# Patient Record
Sex: Male | Born: 1965 | Race: White | Hispanic: No | Marital: Married | Smoking: Never smoker
Health system: Southern US, Community
[De-identification: ages and names within clinical notes are randomized; demographics above are authoritative.]

## PROBLEM LIST (undated history)

## (undated) DIAGNOSIS — K219 Gastro-esophageal reflux disease without esophagitis: Secondary | ICD-10-CM

## (undated) DIAGNOSIS — R112 Nausea with vomiting, unspecified: Secondary | ICD-10-CM

## (undated) DIAGNOSIS — F411 Generalized anxiety disorder: Secondary | ICD-10-CM

## (undated) DIAGNOSIS — K222 Esophageal obstruction: Secondary | ICD-10-CM

## (undated) DIAGNOSIS — E039 Hypothyroidism, unspecified: Secondary | ICD-10-CM

## (undated) DIAGNOSIS — E7800 Pure hypercholesterolemia, unspecified: Secondary | ICD-10-CM

## (undated) DIAGNOSIS — N200 Calculus of kidney: Secondary | ICD-10-CM

## (undated) DIAGNOSIS — K589 Irritable bowel syndrome without diarrhea: Secondary | ICD-10-CM

## (undated) DIAGNOSIS — R0789 Other chest pain: Secondary | ICD-10-CM

## (undated) DIAGNOSIS — Z87442 Personal history of urinary calculi: Secondary | ICD-10-CM

## (undated) DIAGNOSIS — Z86010 Personal history of colonic polyps: Secondary | ICD-10-CM

## (undated) DIAGNOSIS — K224 Dyskinesia of esophagus: Secondary | ICD-10-CM

## (undated) DIAGNOSIS — Z860101 Personal history of adenomatous and serrated colon polyps: Secondary | ICD-10-CM

## (undated) DIAGNOSIS — J309 Allergic rhinitis, unspecified: Secondary | ICD-10-CM

## (undated) DIAGNOSIS — F410 Panic disorder [episodic paroxysmal anxiety] without agoraphobia: Secondary | ICD-10-CM

## (undated) DIAGNOSIS — E785 Hyperlipidemia, unspecified: Secondary | ICD-10-CM

## (undated) DIAGNOSIS — Z8659 Personal history of other mental and behavioral disorders: Secondary | ICD-10-CM

## (undated) DIAGNOSIS — Z9889 Other specified postprocedural states: Secondary | ICD-10-CM

## (undated) HISTORY — PX: OTHER SURGICAL HISTORY: SHX169

## (undated) HISTORY — DX: Other chest pain: R07.89

## (undated) HISTORY — DX: Calculus of kidney: N20.0

## (undated) HISTORY — DX: Generalized anxiety disorder: F41.1

## (undated) HISTORY — DX: Personal history of colonic polyps: Z86.010

## (undated) HISTORY — DX: Pure hypercholesterolemia, unspecified: E78.00

## (undated) HISTORY — DX: Dyskinesia of esophagus: K22.4

## (undated) HISTORY — DX: Esophageal obstruction: K22.2

## (undated) HISTORY — PX: WISDOM TOOTH EXTRACTION: SHX21

## (undated) HISTORY — DX: Personal history of adenomatous and serrated colon polyps: Z86.0101

## (undated) HISTORY — DX: Panic disorder (episodic paroxysmal anxiety): F41.0

## (undated) HISTORY — DX: Irritable bowel syndrome, unspecified: K58.9

## (undated) HISTORY — DX: Allergic rhinitis, unspecified: J30.9

## (undated) HISTORY — DX: Personal history of other mental and behavioral disorders: Z86.59

## (undated) HISTORY — PX: TONSILLECTOMY: SUR1361

---

## 2001-06-13 ENCOUNTER — Emergency Department (HOSPITAL_COMMUNITY): Admission: EM | Admit: 2001-06-13 | Discharge: 2001-06-13 | Payer: Self-pay | Admitting: Emergency Medicine

## 2001-06-13 ENCOUNTER — Encounter: Payer: Self-pay | Admitting: Emergency Medicine

## 2001-12-01 ENCOUNTER — Encounter: Payer: Self-pay | Admitting: Family Medicine

## 2001-12-01 ENCOUNTER — Encounter: Admission: RE | Admit: 2001-12-01 | Discharge: 2001-12-01 | Payer: Self-pay | Admitting: Family Medicine

## 2002-08-12 ENCOUNTER — Ambulatory Visit (HOSPITAL_BASED_OUTPATIENT_CLINIC_OR_DEPARTMENT_OTHER): Admission: RE | Admit: 2002-08-12 | Discharge: 2002-08-12 | Payer: Self-pay | Admitting: General Surgery

## 2003-01-28 HISTORY — PX: INGUINAL HERNIA REPAIR: SUR1180

## 2003-12-29 ENCOUNTER — Encounter: Admission: RE | Admit: 2003-12-29 | Discharge: 2003-12-29 | Payer: Self-pay | Admitting: Family Medicine

## 2004-09-04 ENCOUNTER — Encounter: Admission: RE | Admit: 2004-09-04 | Discharge: 2004-09-04 | Payer: Self-pay | Admitting: Family Medicine

## 2004-09-19 ENCOUNTER — Ambulatory Visit (HOSPITAL_COMMUNITY): Admission: RE | Admit: 2004-09-19 | Discharge: 2004-09-19 | Payer: Self-pay | Admitting: Urology

## 2004-09-19 ENCOUNTER — Ambulatory Visit (HOSPITAL_BASED_OUTPATIENT_CLINIC_OR_DEPARTMENT_OTHER): Admission: RE | Admit: 2004-09-19 | Discharge: 2004-09-19 | Payer: Self-pay | Admitting: Urology

## 2004-10-17 ENCOUNTER — Ambulatory Visit (HOSPITAL_COMMUNITY): Admission: RE | Admit: 2004-10-17 | Discharge: 2004-10-17 | Payer: Self-pay | Admitting: Urology

## 2006-10-29 ENCOUNTER — Emergency Department (HOSPITAL_COMMUNITY): Admission: EM | Admit: 2006-10-29 | Discharge: 2006-10-30 | Payer: Self-pay | Admitting: *Deleted

## 2008-05-15 ENCOUNTER — Encounter: Admission: RE | Admit: 2008-05-15 | Discharge: 2008-05-15 | Payer: Self-pay | Admitting: Family Medicine

## 2008-05-16 ENCOUNTER — Encounter: Admission: RE | Admit: 2008-05-16 | Discharge: 2008-05-16 | Payer: Self-pay | Admitting: Family Medicine

## 2008-09-25 ENCOUNTER — Encounter: Admission: RE | Admit: 2008-09-25 | Discharge: 2008-09-25 | Payer: Self-pay | Admitting: Family Medicine

## 2009-10-18 ENCOUNTER — Encounter: Admission: RE | Admit: 2009-10-18 | Discharge: 2009-10-18 | Payer: Self-pay | Admitting: Family Medicine

## 2010-06-14 NOTE — Op Note (Signed)
Sean Dickson, Sean Dickson                  ACCOUNT NO.:  [id number]   MEDICAL RECORD NO.:  [id number]          PATIENT TYPE:  AMB   LOCATION:  DAY                          FACILITY:  [facility]   PHYSICIAN:  Mark C. Vernie Ammons, M.D.  DATE OF BIRTH:  December 01, 1965   DATE OF PROCEDURE:  09/19/2004  DATE OF DISCHARGE:                                 OPERATIVE REPORT   PREOPERATIVE DIAGNOSIS:  Right renal calculi.   POSTOPERATIVE DIAGNOSIS:  Right renal calculi.   PROCEDURE:  Cystoscopy, right retrograde pyelogram with interpretation and  right double-J stent placement.   SURGEON:  Dr. Vernie Ammons   ANESTHESIA:  General.   DRAINS:  4.5 French 28 cm double-J stent in the right ureter (no string).   BLOOD LOSS:  None.   SPECIMENS:  None.   COMPLICATIONS:  None.   INDICATIONS:  The patient is a 45 year old male who has 2 right renal  calculi.  Their size measures approximately 10 x 6 and 12 x 9 mm.  Due to  the large total stone bulk, we have discussed treatment options.  The  patient has elected to proceed with lithotripsy and in preparation for that,  a double-J stent is to be placed.   DESCRIPTION OF OPERATION:  After informed consent, the patient brought to  the major OR, placed on table, administered general anesthesia, then moved  to the dorsolithotomy position.  His genitalia was sterilely prepped and  draped.  Initially, a 21-French cystoscope with obturator was introduced in  the bladder.  The bladder was drained and the 12-degree lens was inserted.  The bladder was fully inspected and noted be free of any tumor, stones, or  inflammatory lesions.  Ureteral orifices normal in configuration and  position.  The prostatic urethra was visualized and noted to be normal  without lesions and nonobstructing.   An open-ended 6-French ureteral catheter was then placed into the right  orifice and a right retrograde pyelogram was performed by instilling  contrast material through the stent up the right  ureter under direct  fluoroscopic control.  The ureter was noted to be normal throughout its  entire length with no filling defects, stones, or other abnormality.  The  renal pelvis was relatively small.  The collecting systems were normal.  Two  stones were seen in the lower pole calix which is noted to have a wide  infundibulum.   A 0.038 inch floppy tip guidewire was then passed through the open-ended  stent, up the right ureter under fluoroscopic control, and the open-end  stent was then removed.  The double-J stent was then passed over the  guidewire into the renal pelvis and the guidewire partially removed with  good curl being noted in the renal pelvis.  I then removed the guidewire  entirely with good curl being noted in the bladder as well.  The bladder was  drained; the  patient received a B&O suppository and 2% lidocaine urethral anesthetic and  was awakened and taken to recovery room in stable satisfactory condition.  He tolerated the procedure well.  There were no  intraoperative  complications.  He has a prescription for Vicodin and will undergo  lithotripsy later today.      Mark C. Vernie Ammons, M.D.  Electronically Signed     MCO/MEDQ  D:  09/19/2004  T:  09/19/2004  Job:  [id number]

## 2010-06-14 NOTE — Op Note (Signed)
NAME:  Sean Dickson, Sean Dickson                            ACCOUNT NO.:  [id number]   MEDICAL RECORD NO.:  [id number]                   PATIENT TYPE:  AMB   LOCATION:  DSC                                  FACILITY:  [facility]   PHYSICIAN:  Jimmye Norman III, M.D.               DATE OF BIRTH:  [birth year]   DATE OF PROCEDURE:  08/12/2002  DATE OF DISCHARGE:                                 OPERATIVE REPORT   PREOPERATIVE DIAGNOSIS:  Right inguinal hernia with possible left inguinal  hernia.   POSTOPERATIVE DIAGNOSIS:  Right inguinal hernia, small, indirect and larger  direct with no left inguinal hernia.   PROCEDURE:  Laparoscopic right inguinal hernia repair.   SURGEON:  Jimmye Norman, M.D.   ASSISTANT:  None.   ANESTHESIA:  General endotracheal.   ESTIMATED BLOOD LOSS:  Less than 20-30 mL.   COMPLICATIONS:  None.   CONDITION:  Stable.   FINDINGS:  Small, indirect hernia on the right side with a larger direct  defect.  There was no hernia on the left side but a small lipoma.   OPERATION:  The patient was taken to the operating room, placed on the table  in a supine position.  After an adequate endotracheal anesthetic was  administered, he was prepped and draped in the usual sterile manner,  exposing in the midline of the abdomen.   In the right paraumbilical area, a transverse incision was made 1.5 cm long  with a #15 blade, taken down to the anterior rectus sheath.  This was split  using #15 blade, and then we split the rectus muscle down to the posterior  rectus sheath.  It was at this plane that the dissecting balloon was passed  down into the pubic tubercle and then subsequently insufflated with 35 pumps  of gas.  Once this was done, we were able to see with the endoscope that was  passed through the dissecting balloon that there was adequate exposure in  the preperitoneal space, exposing the Cooper's ligament on both sides.   We subsequently removed the balloon and then placed an  operating cannula  into the preperitoneal space, inflating its ballon up to 30 mL of air.  We  then passed the endoscope with attached light source and camera into the  preperitoneal space, then passed two 5 mm cannulas in the midline at the  suprapubic area and sort of equal distance between the suprapubic area and  the umbilicus.  Care was taken to pass these directly as they both had sharp  jibs.   Once all cannulas were in place, we started the dissection on the right side  where we could see the small indirect sac tethering along with the spermatic  cord and also the direct defect sort of superolateral to the spermatic cord.  We dissected away the sac from the cord and circumferentially dissected a  window around  the spermatic cord on the right side and subsequently tacked  down a piece of mesh measuring approximately 8 x 5 cm in size.  We did not  split the mesh and placed it on top of the spermatic cord, making sure that  the dissected sac was tacked down to the outer surface of the mesh itself.  Care was taken not to put down low lateral with spiral tacking sutures.   We dissected out the spermatic cord on the left side and could not find  evidence of a hernia sac or direct defect.  Therefore, was not done on the  left side.  Once we had completed all, allowed all gas to escape.   We reapproximated the anterior fascia using figure-of-eight suture of 0  Vicryl.  We used 0.25% Marcaine with epinephrine at all sites and then  closed the skin using running subcuticular suture of 5-0 Vicryl.  Sterile  dressings were applied.  All needle counts, sponge counts, and instrument  counts were correct.                                               Kathrin Ruddy, M.D.    JW/MEDQ  D:  08/12/2002  T:  08/14/2002  Job:  [id number]

## 2010-11-07 LAB — BASIC METABOLIC PANEL
CO2: 28
Creatinine, Ser: 1.23
Glucose, Bld: 126 — ABNORMAL HIGH
Potassium: 3.9
Sodium: 138

## 2010-11-07 LAB — CBC
Hemoglobin: 14.3
MCHC: 34.3
MCV: 91.2
WBC: 6.4

## 2010-11-07 LAB — POCT CARDIAC MARKERS
CKMB, poc: 1
Troponin i, poc: <0.05

## 2011-01-04 ENCOUNTER — Encounter: Payer: Self-pay | Admitting: *Deleted

## 2011-01-04 ENCOUNTER — Other Ambulatory Visit: Payer: Self-pay

## 2011-01-04 ENCOUNTER — Emergency Department (INDEPENDENT_AMBULATORY_CARE_PROVIDER_SITE_OTHER): Payer: 59

## 2011-01-04 ENCOUNTER — Emergency Department (HOSPITAL_BASED_OUTPATIENT_CLINIC_OR_DEPARTMENT_OTHER)
Admission: EM | Admit: 2011-01-04 | Discharge: 2011-01-04 | Disposition: A | Discharge status: Home / Self Care | Payer: 59 | Attending: Emergency Medicine | Admitting: Emergency Medicine

## 2011-01-04 DIAGNOSIS — R079 Chest pain, unspecified: Secondary | ICD-10-CM | POA: Insufficient documentation

## 2011-01-04 DIAGNOSIS — K219 Gastro-esophageal reflux disease without esophagitis: Secondary | ICD-10-CM | POA: Insufficient documentation

## 2011-01-04 HISTORY — DX: Gastro-esophageal reflux disease without esophagitis: K21.9

## 2011-01-04 LAB — BASIC METABOLIC PANEL
BUN: 20 mg/dL (ref 6–23)
Calcium: 10 mg/dL (ref 8.4–10.5)
Chloride: 101 mEq/L (ref 96–112)
Creatinine, Ser: 1 mg/dL (ref 0.50–1.35)
Glucose, Bld: 104 mg/dL — ABNORMAL HIGH (ref 70–99)

## 2011-01-04 LAB — PRO B NATRIURETIC PEPTIDE: Pro B Natriuretic peptide (BNP): 80.9 pg/mL (ref 0–125)

## 2011-01-04 LAB — CARDIAC PANEL(CRET KIN+CKTOT+MB+TROPI)
CK, MB: 2.9 ng/mL (ref 0.3–4.0)
Relative Index: 2 (ref 0.0–2.5)

## 2011-01-04 LAB — CBC
MCV: 90.7 fL (ref 78.0–100.0)
Platelets: 233 10*3/uL (ref 150–400)
RBC: 4.62 MIL/uL (ref 4.22–5.81)

## 2011-01-04 MED ORDER — IBUPROFEN 400 MG PO TABS
600.0000 mg | ORAL_TABLET | Freq: Once | ORAL | Status: AC
  Administered 2011-01-04: 600 mg via ORAL
  Filled 2011-01-04: qty 1

## 2011-01-04 MED ORDER — ASPIRIN 81 MG PO CHEW
324.0000 mg | CHEWABLE_TABLET | Freq: Once | ORAL | Status: AC
  Administered 2011-01-04: 324 mg via ORAL

## 2011-01-04 MED ORDER — NITROGLYCERIN 0.4 MG SL SUBL
0.4000 mg | SUBLINGUAL_TABLET | SUBLINGUAL | Status: DC | PRN
  Administered 2011-01-04 (×2): 0.4 mg via SUBLINGUAL
  Filled 2011-01-04: qty 25

## 2011-01-04 MED ORDER — ACETAMINOPHEN 500 MG PO TABS
1000.0000 mg | ORAL_TABLET | Freq: Once | ORAL | Status: AC
  Administered 2011-01-04: 1000 mg via ORAL
  Filled 2011-01-04: qty 2

## 2011-01-04 MED ORDER — ASPIRIN 81 MG PO CHEW
CHEWABLE_TABLET | ORAL | Status: AC
  Administered 2011-01-04: 324 mg via ORAL
  Filled 2011-01-04: qty 4

## 2011-01-04 MED ORDER — SODIUM CHLORIDE 0.9 % IV SOLN
Freq: Once | INTRAVENOUS | Status: AC
  Administered 2011-01-04: 14:00:00 via INTRAVENOUS

## 2011-01-04 NOTE — ED Notes (Signed)
Patient states pain level 2 of 10

## 2011-01-04 NOTE — ED Notes (Signed)
Chest pain left sternal sore radiates to left arm, pain accompanied by shortness of breath, nausea and diaporesis x 1 day,

## 2011-01-04 NOTE — ED Provider Notes (Signed)
History     CSN: [id number] Arrival date & time: 01/04/2011  1:20 PM   First MD Initiated Contact with Patient 01/04/11 1436      Chief Complaint  Patient presents with  . Chest Pain    (Consider location/radiation/quality/duration/timing/severity/associated sxs/prior treatment) HPI Comments: Patient presents with left-sided chest pain that began earlier today.  He describes it as a tightness sensation.  It is not associated with any cough, fevers, shortness of breath.  It is not pleuritic in nature.  Patient does describe some lightheadedness and dizziness with this but did not have any syncopal episode with this.  He did note some sweating earlier with this but that has resolved.  His pain was approximately a 3/10 on arrival here and did receive a nitroglycerin per the nursing protocol which may have relieved his pain but patient notes that once he was here resting he was pain-free.  He did drive himself here.  He has not had prior cardiac history.  Patient denies that he has ever smoked, drank or used any drugs.  He states he did have a stress test several years ago and was normal but has not had one in the last one to 2 years.  He does have a primary care physician whom he sees regularly.    Patient is a 45 y.o. male presenting with chest pain. The history is provided by the patient. No language interpreter was used.  Chest Pain The chest pain began 3 - 5 hours ago. Chest pain occurs intermittently. The chest pain is resolved. The severity of the pain is moderate. The quality of the pain is described as tightness. The pain does not radiate. Primary symptoms include dizziness. Pertinent negatives for primary symptoms include no fever, no fatigue, no syncope, no shortness of breath, no cough, no wheezing, no palpitations, no abdominal pain, no nausea, no vomiting and no altered mental status.  Dizziness also occurs with diaphoresis. Dizziness does not occur with nausea or vomiting.    Associated symptoms include diaphoresis. He tried nothing for the symptoms. Risk factors include male gender.     Past Medical History  Diagnosis Date  . GERD (gastroesophageal reflux disease)     History reviewed. No pertinent past surgical history.  History reviewed. No pertinent family history.  History  Substance Use Topics  . Smoking status: Never Smoker   . Smokeless tobacco: Not on file  . Alcohol Use: No      Review of Systems  Constitutional: Positive for diaphoresis. Negative for fever, chills and fatigue.  HENT: Negative.   Eyes: Negative.  Negative for discharge and redness.  Respiratory: Positive for chest tightness. Negative for cough, shortness of breath and wheezing.   Cardiovascular: Positive for chest pain. Negative for palpitations and syncope.  Gastrointestinal: Negative.  Negative for nausea, vomiting and abdominal pain.  Genitourinary: Negative.  Negative for hematuria.  Musculoskeletal: Negative.  Negative for back pain.  Skin: Negative.  Negative for color change and rash.  Neurological: Positive for dizziness and light-headedness. Negative for syncope and headaches.  Hematological: Negative.  Negative for adenopathy.  Psychiatric/Behavioral: Negative.  Negative for confusion and altered mental status.  All other systems reviewed and are negative.    Allergies  Codeine  Home Medications   Current Outpatient Rx  Name Route Sig Dispense Refill  . ESOMEPRAZOLE MAGNESIUM 20 MG PO CPDR Oral Take 20 mg by mouth daily before breakfast.        BP 117/74  Pulse 77  Temp(Src)  97.9 F (36.6 C) (Oral)  Resp 18  Ht 6\' 4"  (1.93 m)  Wt 275 lb (124.739 kg)  BMI 33.47 kg/m2  SpO2 96%  Physical Exam  Constitutional: He is oriented to person, place, and time. He appears well-developed and well-nourished.  Non-toxic appearance. He does not have a sickly appearance.  HENT:  Head: Normocephalic and atraumatic.  Eyes: Conjunctivae, EOM and lids are  normal. Pupils are equal, round, and reactive to light.  Neck: Trachea normal, normal range of motion and full passive range of motion without pain. Neck supple.  Cardiovascular: Normal rate, regular rhythm and normal heart sounds.   Pulmonary/Chest: Effort normal and breath sounds normal. No respiratory distress. He has no wheezes. He has no rales.  Abdominal: Soft. Normal appearance. He exhibits no distension. There is no tenderness. There is no rebound and no CVA tenderness.  Musculoskeletal: Normal range of motion.  Neurological: He is alert and oriented to person, place, and time. He has normal strength.  Skin: Skin is warm, dry and intact. No rash noted.  Psychiatric: He has a normal mood and affect. His behavior is normal. Judgment and thought content normal.    ED Course  Procedures (including critical care time)  Results for orders placed during the hospital encounter of 01/04/11  CBC      Component Value Range   WBC 5.4  4.0 - 10.5 (K/uL)   RBC 4.62  4.22 - 5.81 (MIL/uL)   Hemoglobin 14.2  13.0 - 17.0 (g/dL)   HCT 81.1  91.4 - 78.2 (%)   MCV 90.7  78.0 - 100.0 (fL)   MCH 30.7  26.0 - 34.0 (pg)   MCHC 33.9  30.0 - 36.0 (g/dL)   RDW 95.6  21.3 - 08.6 (%)   Platelets 233  150 - 400 (K/uL)  BASIC METABOLIC PANEL      Component Value Range   Sodium 139  135 - 145 (mEq/L)   Potassium 4.3  3.5 - 5.1 (mEq/L)   Chloride 101  96 - 112 (mEq/L)   CO2 29  19 - 32 (mEq/L)   Glucose, Bld 104 (*) 70 - 99 (mg/dL)   BUN 20  6 - 23 (mg/dL)   Creatinine, Ser 5.78  0.50 - 1.35 (mg/dL)   Calcium 46.9  8.4 - 10.5 (mg/dL)   GFR calc non Af Amer 90 (*) >90 (mL/min)   GFR calc Af Amer >90  >90 (mL/min)  PRO B NATRIURETIC PEPTIDE      Component Value Range   BNP, POC 80.9  0 - 125 (pg/mL)  CARDIAC PANEL(CRET KIN+CKTOT+MB+TROPI)      Component Value Range   Total CK 145  7 - 232 (U/L)   CK, MB 2.9  0.3 - 4.0 (ng/mL)   Troponin I <0.30  <0.30 (ng/mL)   Relative Index 2.0  0.0 - 2.5    Dg  Chest 2 View  01/04/2011  *RADIOLOGY REPORT*  Clinical Data: Chest pain  CHEST - 2 VIEW  Comparison: 10/29/2006  Findings: Normal heart size.  Clear lungs.  Low volumes.  No pneumothorax.  No pleural fluid. Increased AP diameter the chest.  IMPRESSION: No active cardiopulmonary disease.  Original Report Authenticated By: Donavan Burnet, M.D.      Date: 01/04/2011  Rate: 80  Rhythm: normal sinus rhythm  QRS Axis: left  Intervals: normal  ST/T Wave abnormalities: normal  Conduction Disutrbances:none  Narrative Interpretation:   Old EKG Reviewed: none available    MDM  Patient presents with low risk factors for ACS although his story could potentially be consistent with ACS.  His EKG is completely normal at this time.  His initial set of markers are normal as well.  Given this patient is a low risk by TIMI score and has a primary care physician whom he can followup with this week I feel that 2 sets of markers can effectively rule out MI in this patient and he can have an outpatient stress test later this week ordered by his primary care physician.  Patient also understands the importance of returning if he does have worsening pain, shortness of breath or other symptoms.        Nat Christen, MD 01/04/11 1556

## 2011-01-04 NOTE — ED Notes (Signed)
Pt presents to ED today with c/o CP with associated nausea and lightheadedness.  Pt has hx of same.  EKG done in triage

## 2011-01-04 NOTE — ED Notes (Signed)
Patient states pain level 0

## 2011-01-04 NOTE — ED Notes (Signed)
Patient states pain level 3 of 10

## 2012-11-23 ENCOUNTER — Ambulatory Visit (INDEPENDENT_AMBULATORY_CARE_PROVIDER_SITE_OTHER): Payer: 59 | Admitting: Podiatry

## 2012-11-23 ENCOUNTER — Encounter: Payer: Self-pay | Admitting: Podiatry

## 2012-11-23 VITALS — BP 144/84 | HR 78 | Resp 12 | Ht 76.0 in | Wt 275.0 lb

## 2012-11-23 DIAGNOSIS — Q828 Other specified congenital malformations of skin: Secondary | ICD-10-CM

## 2012-11-23 NOTE — Progress Notes (Signed)
  Subjective:    Patient ID: Sean Dickson, male    DOB: 01-Feb-1965, 47 y.o.   MRN: [id number]  HPI Comments: '' THE LT FOOT HAVE A KNOT UNDER THE HEEL. THE KNOT BEEN 3-4 WEEKS, BUT TODAY IS MUCH BETTER. ALSO, RT FOOT BIG TOE GET SORE ''     Review of Systems  Constitutional: Negative.   HENT: Negative.   Eyes: Negative.   Respiratory: Negative.   Cardiovascular: Negative.   Gastrointestinal: Negative.   Endocrine: Negative.   Genitourinary: Negative.   Musculoskeletal: Negative.   Skin: Negative.   Allergic/Immunologic: Negative.   Neurological: Negative.   Hematological: Negative.   Psychiatric/Behavioral: Negative.        Objective:   Physical Exam: I reviewed his past medical history is medications and allergies. His vital signs stable he is alert and oriented x3. Vascular evaluation is intact bilateral. Deep tendon reflexes and muscle strength is intact bilateral and full. Cutaneous evaluation demonstrates a reactive hyperkeratosis to the plantar aspect of his left heel. I debrided this today to normal tissue. His symptomatology was resolved at this time. Evaluation of the hallux nail right does not demonstrate an ingrown nail only irritation from the second toe along the tibial border. I did express to him that we can fix this with a matrixectomy at some point.        Assessment & Plan:  Impression: Poor keratoma plantar aspect left heel. Care rule out a wart at this time. Mild ingrown nail hallux right long fibular border.  Plan: We discussed in great detail today the etiology pathology conservative versus surgical therapies at this point we did discuss possible matrixectomy fibular border of the hallux right. I did discuss and perform a debridement of the reactive hyperkeratosis to the plantar left heel leaving him asymptomatic. I did offer him a chemical destruction of lesion he declined stating that he would sweating he could not keep it dry for 3 or 4 days. We'll followup  with him on an as-needed basis.

## 2012-12-20 ENCOUNTER — Telehealth: Payer: Self-pay | Admitting: *Deleted

## 2012-12-20 NOTE — Telephone Encounter (Signed)
Pt states that Dr Al Corpus scraped out a hard spot on his left heel about 4 weeks ago, and initially it began to fill better, but now hurts as before.  I checked his last visit, the area on his left heel would act like a blocked sweat gland and often may become plugged again and need additional treatment.  Transferred pt to scheduler.

## 2013-01-04 ENCOUNTER — Ambulatory Visit (INDEPENDENT_AMBULATORY_CARE_PROVIDER_SITE_OTHER): Payer: 59 | Admitting: Podiatry

## 2013-01-04 ENCOUNTER — Encounter: Payer: Self-pay | Admitting: Podiatry

## 2013-01-04 VITALS — BP 127/83 | HR 78 | Resp 16

## 2013-01-04 DIAGNOSIS — B079 Viral wart, unspecified: Secondary | ICD-10-CM

## 2013-01-04 NOTE — Progress Notes (Signed)
   Subjective:    Patient ID: Sean Dickson, male    DOB: 1965-03-11, 47 y.o.   MRN: [id number]  HPI Comments:  " place on the bottom of my left foot that he dug out is real sore and tender again"     Review of Systems     Objective:   Physical Exam: Gordy presents today for followup of a painful lesion plantar left heel. Last time he was in it appeared that it is only porokeratosis. Today thrombosed capillaries are visible upon debridement as well as the deep mass of a verrucoid lesion. He also has a painful ingrown toenail to the hallux right. He would like to have both of these taken care of in the near future they really doesn't want anything done nail prior to him leaving for the holidays.        Assessment & Plan:  Assessment: Verruca plantaris left heel and ingrown toenail hallux right.  Plan: I will followup with him the week before Christmas for chemical matrixectomy right hallux and excision of wart left heel.

## 2013-01-17 ENCOUNTER — Ambulatory Visit (INDEPENDENT_AMBULATORY_CARE_PROVIDER_SITE_OTHER): Payer: 59 | Admitting: Podiatry

## 2013-01-17 ENCOUNTER — Encounter: Payer: Self-pay | Admitting: Podiatry

## 2013-01-17 VITALS — BP 123/77 | HR 87 | Resp 16 | Ht 76.0 in | Wt 280.0 lb

## 2013-01-17 DIAGNOSIS — B070 Plantar wart: Secondary | ICD-10-CM

## 2013-01-17 DIAGNOSIS — L600 Ingrowing nail: Secondary | ICD-10-CM

## 2013-01-17 MED ORDER — NEOMYCIN-POLYMYXIN-HC 3.5-10000-1 OT SOLN: OTIC | Status: DC

## 2013-01-17 NOTE — Progress Notes (Signed)
Rate presents today with a chief complaint of ingrown toenail to the fibular border of the hallux right. This is been 1 on for quite some time and he would like to have it removed. He also has a wart to the plantar aspect of his left foot he would also like to have this removed.  Objective: Vital signs are stable he is alert and oriented x3. Pulses are strongly palpable bilateral. Sharp incurvated nail margin along the fibular border of the hallux right demonstrates erythema and edema with pain on palpation. He also has a solitary verrucoid lesion to the plantar lateral aspect of his left heel. This is exquisitely tender on palpation even with the lightest of strokes.  Assessment: Ingrown nail paronychia abscess hallux right fibular border. Warts plantar aspect of the left foot.  Plan: At this point a chemical matrixectomy was performed to the fibular border of the hallux right. At this point curettage of the wart was performed to the plantar aspect of the left heel. He tolerated these procedures well and was given both oral and written home-going instructions for the care of the procedure. I will followup with him in one week. He was also supplied with a prescription for Cortisporin Otic to be applied to the fibular border of the hallux right after soaking twice daily.

## 2013-01-17 NOTE — Patient Instructions (Signed)

## 2013-01-24 ENCOUNTER — Encounter: Payer: Self-pay | Admitting: Podiatry

## 2013-01-25 ENCOUNTER — Ambulatory Visit (INDEPENDENT_AMBULATORY_CARE_PROVIDER_SITE_OTHER): Payer: 59 | Admitting: Podiatry

## 2013-01-25 ENCOUNTER — Encounter: Payer: Self-pay | Admitting: Podiatry

## 2013-01-25 VITALS — BP 135/91 | HR 85 | Resp 16

## 2013-01-25 DIAGNOSIS — Z9889 Other specified postprocedural states: Secondary | ICD-10-CM

## 2013-01-25 NOTE — Progress Notes (Signed)
Sean Dickson presents today for followup of the surgical curettage to his left heel and a matrixectomy to the tibial and fibular border of the hallux right. He states that both of them have been doing wonderful he has no problems with it whatsoever he's been soaking in Betadine.  Objective: Vital signs are stable he is alert and oriented x3 today no erythema edema saline is drainage or odor. Both of these areas are healed by approximately 80-90% and should going to heal uneventfully.  Assessment: Well-healing surgical feet.  Plan: Discontinue Betadine start with Epsom salts and water soaks twice daily cover the day and leave open at night. Followup with him as needed

## 2013-03-08 ENCOUNTER — Encounter: Payer: Self-pay | Admitting: Podiatry

## 2013-03-08 ENCOUNTER — Ambulatory Visit (INDEPENDENT_AMBULATORY_CARE_PROVIDER_SITE_OTHER): Payer: 59 | Admitting: Podiatry

## 2013-03-08 VITALS — BP 118/79 | HR 85 | Resp 16

## 2013-03-08 DIAGNOSIS — B079 Viral wart, unspecified: Secondary | ICD-10-CM

## 2013-03-08 DIAGNOSIS — L03039 Cellulitis of unspecified toe: Secondary | ICD-10-CM

## 2013-03-08 MED ORDER — CEPHALEXIN 500 MG PO CAPS: 500.0000 mg | ORAL_CAPSULE | Freq: Three times a day (TID) | ORAL | Status: DC

## 2013-03-08 NOTE — Progress Notes (Signed)
The heel that he removed the wart , there is still a sore spot .  Objective: Vital signs are stable he is alert and oriented x3. Verruca plantaris is still present to the left heel medial aspect.  Assessment: Verruca plantaris.  Plan: Surgical curettage verruca plantaris left heel.

## 2013-03-15 ENCOUNTER — Ambulatory Visit: Payer: 59 | Admitting: Podiatry

## 2013-03-22 ENCOUNTER — Ambulatory Visit (INDEPENDENT_AMBULATORY_CARE_PROVIDER_SITE_OTHER): Payer: 59 | Admitting: Podiatry

## 2013-03-22 ENCOUNTER — Encounter: Payer: Self-pay | Admitting: Podiatry

## 2013-03-22 VITALS — BP 131/85 | HR 78 | Resp 16

## 2013-03-22 DIAGNOSIS — B079 Viral wart, unspecified: Secondary | ICD-10-CM

## 2013-03-22 NOTE — Progress Notes (Signed)
Sean Dickson presents today for followup of his wart to the plantar aspect of his left foot. He states it is still tender.  Objective: Vital signs are stable he is alert and oriented x3 the paronychia to the hallux right appears to be healing quite nicely the left heel does demonstrate an area of fibrin deposition overlying the surgical site. The area slightly macerated more than likely secondary to overuse of Band-Aids or Vaseline or both.  Assessment: Well-healing paronychia hallux right slowly healing surgical excision wart left foot.  Plan: Start every other night use of a Band-Aid with Aquaphor or Vaseline. Continue so twice daily with Epsom salts and water. I will followup with him in 2 weeks to make sure he is healing well.

## 2013-04-07 ENCOUNTER — Encounter: Payer: Self-pay | Admitting: Podiatry

## 2013-04-07 ENCOUNTER — Ambulatory Visit (INDEPENDENT_AMBULATORY_CARE_PROVIDER_SITE_OTHER): Payer: 59 | Admitting: Podiatry

## 2013-04-07 VITALS — BP 118/79 | HR 85 | Resp 16

## 2013-04-07 DIAGNOSIS — L03119 Cellulitis of unspecified part of limb: Secondary | ICD-10-CM

## 2013-04-07 DIAGNOSIS — Z9889 Other specified postprocedural states: Secondary | ICD-10-CM

## 2013-04-07 DIAGNOSIS — L02619 Cutaneous abscess of unspecified foot: Secondary | ICD-10-CM

## 2013-04-07 MED ORDER — CEPHALEXIN 500 MG PO CAPS: 500.0000 mg | ORAL_CAPSULE | Freq: Three times a day (TID) | ORAL | Status: DC

## 2013-04-08 NOTE — Progress Notes (Signed)
Sean Dickson presents today for followup of his excision wart plantar aspect of his left foot. He states that the area still very sore.  Objective: Vital signs are stable he is alert and oriented x3. There is mild erythema no edema no cellulitis drainage or odor that I can see. His skin has gone on to heal over the wound rather than healing from deep to superficial.  Assessment: Possible infection do to entrapment of drainage.  Plan started him on antibiotics and I debrided the area today and opened the lesion. Followup with him in 2 weeks to see if this feels better to him.

## 2013-04-21 ENCOUNTER — Encounter: Payer: Self-pay | Admitting: Podiatry

## 2013-04-21 ENCOUNTER — Ambulatory Visit (INDEPENDENT_AMBULATORY_CARE_PROVIDER_SITE_OTHER): Payer: 59 | Admitting: Podiatry

## 2013-04-21 VITALS — BP 147/74 | HR 76 | Resp 12

## 2013-04-21 DIAGNOSIS — L02619 Cutaneous abscess of unspecified foot: Secondary | ICD-10-CM

## 2013-04-21 DIAGNOSIS — L03119 Cellulitis of unspecified part of limb: Principal | ICD-10-CM

## 2013-04-21 MED ORDER — CEPHALEXIN 500 MG PO CAPS: 500.0000 mg | ORAL_CAPSULE | Freq: Three times a day (TID) | ORAL | Status: DC

## 2013-04-21 NOTE — Progress Notes (Signed)
He presents today for followup of a infected surgical site to the posterior plantar medial aspect of his left heel. He states it is doing much better after the antibiotics.  Objective: Vital signs are stable he is alert and oriented x3. Reactive hyperkeratosis surrounding the lesion was debrided today there was no bleeding noted. There is no signs of infection.  Assessment: Well-healing surgical site plantar medial aspect of the left heel.  Plan: Continue 1 more round of antibiotics and followup with me as needed.

## 2013-09-17 ENCOUNTER — Encounter: Payer: Self-pay | Admitting: *Deleted

## 2014-09-05 ENCOUNTER — Encounter (HOSPITAL_BASED_OUTPATIENT_CLINIC_OR_DEPARTMENT_OTHER): Payer: Self-pay

## 2014-09-05 ENCOUNTER — Emergency Department (HOSPITAL_BASED_OUTPATIENT_CLINIC_OR_DEPARTMENT_OTHER)
Admission: EM | Admit: 2014-09-05 | Discharge: 2014-09-05 | Disposition: A | Discharge status: Home / Self Care | Payer: 59 | Attending: Emergency Medicine | Admitting: Emergency Medicine

## 2014-09-05 ENCOUNTER — Emergency Department (HOSPITAL_BASED_OUTPATIENT_CLINIC_OR_DEPARTMENT_OTHER): Payer: 59

## 2014-09-05 DIAGNOSIS — K219 Gastro-esophageal reflux disease without esophagitis: Secondary | ICD-10-CM | POA: Insufficient documentation

## 2014-09-05 DIAGNOSIS — Z8659 Personal history of other mental and behavioral disorders: Secondary | ICD-10-CM | POA: Diagnosis not present

## 2014-09-05 DIAGNOSIS — E860 Dehydration: Secondary | ICD-10-CM

## 2014-09-05 DIAGNOSIS — Z79899 Other long term (current) drug therapy: Secondary | ICD-10-CM | POA: Diagnosis not present

## 2014-09-05 DIAGNOSIS — Z86018 Personal history of other benign neoplasm: Secondary | ICD-10-CM | POA: Insufficient documentation

## 2014-09-05 DIAGNOSIS — R0789 Other chest pain: Secondary | ICD-10-CM

## 2014-09-05 DIAGNOSIS — IMO0001 Reserved for inherently not codable concepts without codable children: Secondary | ICD-10-CM

## 2014-09-05 DIAGNOSIS — Z87442 Personal history of urinary calculi: Secondary | ICD-10-CM | POA: Insufficient documentation

## 2014-09-05 DIAGNOSIS — R079 Chest pain, unspecified: Secondary | ICD-10-CM

## 2014-09-05 DIAGNOSIS — Z792 Long term (current) use of antibiotics: Secondary | ICD-10-CM | POA: Diagnosis not present

## 2014-09-05 LAB — CBC WITH DIFFERENTIAL/PLATELET
Basophils Absolute: 0 10*3/uL (ref 0.0–0.1)
Basophils Relative: 0 % (ref 0–1)
Eosinophils Absolute: 0.1 10*3/uL (ref 0.0–0.7)
Eosinophils Relative: 2 % (ref 0–5)
HCT: 42 % (ref 39.0–52.0)
Hemoglobin: 13.8 g/dL (ref 13.0–17.0)
LYMPHS PCT: 23 % (ref 12–46)
Lymphs Abs: 1.2 10*3/uL (ref 0.7–4.0)
MCH: 30.6 pg (ref 26.0–34.0)
MCHC: 32.9 g/dL (ref 30.0–36.0)
MCV: 93.1 fL (ref 78.0–100.0)
Monocytes Absolute: 0.4 10*3/uL (ref 0.1–1.0)
Monocytes Relative: 7 % (ref 3–12)
NEUTROS PCT: 68 % (ref 43–77)
Neutro Abs: 3.5 10*3/uL (ref 1.7–7.7)
Platelets: 261 10*3/uL (ref 150–400)
RBC: 4.51 MIL/uL (ref 4.22–5.81)
RDW: 12.6 % (ref 11.5–15.5)
WBC: 5.2 10*3/uL (ref 4.0–10.5)

## 2014-09-05 LAB — COMPREHENSIVE METABOLIC PANEL
ALK PHOS: 37 U/L — AB (ref 38–126)
ALT: 20 U/L (ref 17–63)
AST: 25 U/L (ref 15–41)
Albumin: 4.6 g/dL (ref 3.5–5.0)
Anion gap: 9 (ref 5–15)
BUN: 27 mg/dL — ABNORMAL HIGH (ref 6–20)
CO2: 27 mmol/L (ref 22–32)
CREATININE: 1.32 mg/dL — AB (ref 0.61–1.24)
Calcium: 9.5 mg/dL (ref 8.9–10.3)
Chloride: 103 mmol/L (ref 101–111)
GFR calc non Af Amer: >60 mL/min (ref >60)
GLUCOSE: 115 mg/dL — AB (ref 65–99)
POTASSIUM: 3.7 mmol/L (ref 3.5–5.1)
Sodium: 139 mmol/L (ref 135–145)
Total Bilirubin: 0.7 mg/dL (ref 0.3–1.2)
Total Protein: 7.5 g/dL (ref 6.5–8.1)

## 2014-09-05 LAB — TROPONIN I: Troponin I: <0.03 ng/mL (ref <0.031)

## 2014-09-05 LAB — LIPASE, BLOOD: LIPASE: 19 U/L — AB (ref 22–51)

## 2014-09-05 MED ORDER — GI COCKTAIL ~~LOC~~
30.0000 mL | Freq: Once | ORAL | Status: AC
  Administered 2014-09-05: 30 mL via ORAL
  Filled 2014-09-05: qty 30

## 2014-09-05 MED ORDER — FAMOTIDINE IN NACL 20-0.9 MG/50ML-% IV SOLN
20.0000 mg | Freq: Once | INTRAVENOUS | Status: AC
  Administered 2014-09-05: 20 mg via INTRAVENOUS
  Filled 2014-09-05: qty 50

## 2014-09-05 MED ORDER — SODIUM CHLORIDE 0.9 % IV BOLUS (SEPSIS)
1000.0000 mL | Freq: Once | INTRAVENOUS | Status: AC
  Administered 2014-09-05: 1000 mL via INTRAVENOUS

## 2014-09-05 NOTE — ED Provider Notes (Signed)
CSN: [id number]     Arrival date & time 09/05/14  2011 History   First MD Initiated Contact with Patient 09/05/14 2038     Chief Complaint  Patient presents with  . Chest Pain     (Consider location/radiation/quality/duration/timing/severity/associated sxs/prior Treatment) The history is provided by the patient.  Sean Dickson is a 49 y.o. male hx of GERD, HL, schatzki's ring here with chest pain, epigastric pain. Ate dinner today and had sudden onset of epigastric pain and chest pain. He states that its worse than his usual reflux. Its a burning sensation, pressure, that resolved. Denies shortness of breath or diaphoresis. Was seen in the ED years ago and had nl labs, trop. He had stress test 5 years ago that was normal. He is not a smoker. Grandfather had MI in late 96s. Denies recent travel or hx of PE.    Past Medical History  Diagnosis Date  . GERD (gastroesophageal reflux disease)     schatzki ring  . Hypercholesteremia   . IBS (irritable bowel syndrome)   . Esophageal spasm   . Schatzki's ring   . Allergic rhinitis   . Kidney stone   . Anxiety state, unspecified     with attacks  . Anxiety attack   . History of panic attacks   . Atypical chest pain   . H/O adenomatous polyp of colon    Past Surgical History  Procedure Laterality Date  . Inguinal hernia repair Right 2005  . Tonsillectomy    . Wisdom tooth extraction     Family History  Problem Relation Age of Onset  . Non-Hodgkin's lymphoma Father   . Other Mother     palpitations  . Macular degeneration Mother   . Uterine cancer Sister   . Colon polyps Sister 73    4 adenoma   History  Substance Use Topics  . Smoking status: Never Smoker   . Smokeless tobacco: Never Used  . Alcohol Use: No    Review of Systems  Cardiovascular: Positive for chest pain.  All other systems reviewed and are negative.     Allergies  Prozac; Nitroglycerin; Atorvastatin; Codeine; Pravastatin; and Red yeast rice  Home  Medications   Prior to Admission medications   Medication Sig Start Date End Date Taking? Authorizing Provider  cephALEXin (KEFLEX) 500 MG capsule Take 1 capsule (500 mg total) by mouth 3 (three) times daily. 04/21/13   Max T Hyatt, DPM  esomeprazole (NEXIUM) 20 MG capsule Take 20 mg by mouth daily before breakfast.      Historical Provider, MD  fenofibrate 160 MG tablet  11/18/12   Historical Provider, MD  neomycin-polymyxin-hydrocortisone (CORTISPORIN) otic solution Apply one to two drops to toe after soaking twice daily. 01/17/13   Max T Hyatt, DPM   BP 125/69 mmHg  Pulse 67  Temp(Src) 98.2 F (36.8 C) (Oral)  Resp 0  Ht 6\' 4"  (1.93 m)  Wt 290 lb (131.543 kg)  BMI 35.31 kg/m2  SpO2 98% Physical Exam  Constitutional: He is oriented to person, place, and time. He appears well-developed and well-nourished.  HENT:  Head: Normocephalic.  Mouth/Throat: Oropharynx is clear and moist.  Eyes: Conjunctivae are normal. Pupils are equal, round, and reactive to light.  Neck: Normal range of motion. Neck supple.  Cardiovascular: Normal rate, regular rhythm and normal heart sounds.   Pulmonary/Chest: Effort normal and breath sounds normal. No respiratory distress. He has no wheezes. He has no rales.  Abdominal: Soft. Bowel sounds are  normal. He exhibits no distension. There is no tenderness. There is no rebound.  Musculoskeletal: Normal range of motion. He exhibits no edema or tenderness.  Neurological: He is alert and oriented to person, place, and time. No cranial nerve deficit. Coordination normal.  Skin: Skin is warm and dry.  Psychiatric: He has a normal mood and affect. His behavior is normal. Judgment and thought content normal.  Nursing note and vitals reviewed.   ED Course  Procedures (including critical care time) Labs Review Labs Reviewed  COMPREHENSIVE METABOLIC PANEL - Abnormal; Notable for the following:    Glucose, Bld 115 (*)    BUN 27 (*)    Creatinine, Ser 1.32 (*)     Alkaline Phosphatase 37 (*)    All other components within normal limits  LIPASE, BLOOD - Abnormal; Notable for the following:    Lipase 19 (*)    All other components within normal limits  CBC WITH DIFFERENTIAL/PLATELET  TROPONIN I  TROPONIN I    Imaging Review Dg Chest 2 View  09/05/2014   CLINICAL DATA:  Chest pain  EXAM: CHEST  2 VIEW  COMPARISON:  01/04/2011  FINDINGS: Cardiomediastinal silhouette is stable. No acute infiltrate or pleural effusion. No pulmonary edema. Mild degenerative changes thoracic spine.  IMPRESSION: No active cardiopulmonary disease.   Electronically Signed   By: Lahoma Crocker M.D.   On: 09/05/2014 22:11     EKG Interpretation   Date/Time:  Tuesday September 05 2014 21:24:12 EDT Ventricular Rate:  74 PR Interval:  214 QRS Duration: 84 QT Interval:  388 QTC Calculation: 430 R Axis:   -12 Text Interpretation:  Sinus rhythm with 1st degree A-V block Otherwise  normal ECG No significant change since last tracing Confirmed by YAO  MD,  DAVID ([id number]) on 09/05/2014 9:33:11 PM      MDM   Final diagnoses:  Chest pain    Sean Dickson is a 49 y.o. male here with chest pain, epigastric pain. Likely reflux. Given family hx of CAD, will get delta trop. I doubt PE or dissection. Will give pepcid, maalox. Will reassess.   11:15 PM Felt better now. Trop neg x 2. CXR nl. Cr slightly elevated at 1.3 likely from dehydration. Given IVF. Likely reflux. Will add prn pepcid to nexium. Will have him get another stress test outpatient.     Wandra Arthurs, MD 09/05/14 [phone]

## 2014-09-05 NOTE — ED Notes (Signed)
Pt reports someone "sitting on my chest." Reports intermittent pain. Sts started while at dinner tonight.

## 2014-09-05 NOTE — Discharge Instructions (Signed)
Continue with omeprazole.   Add pepcid 20 mg as needed for stomach upset.   Stay hydrated. Recheck kidney function in a week.   See your doctor. Consider another stress test.   Return to ER if you have severe chest pain, abdominal pain, vomiting.

## 2014-09-05 NOTE — ED Notes (Signed)
Pt alert, NAD, calm, interactive, resps e/u, speaking in clear complete sentences, rates discomfort 1/10, onset while eating, last ate PTA, last BM today (normal), family at [facility], Dr. Darl Householder into room.

## 2014-09-14 ENCOUNTER — Encounter: Payer: Self-pay | Admitting: Cardiovascular Disease

## 2014-09-14 ENCOUNTER — Ambulatory Visit (INDEPENDENT_AMBULATORY_CARE_PROVIDER_SITE_OTHER): Payer: 59 | Admitting: Cardiovascular Disease

## 2014-09-14 VITALS — BP 158/84 | HR 78 | Ht 76.0 in | Wt 289.8 lb

## 2014-09-14 DIAGNOSIS — K219 Gastro-esophageal reflux disease without esophagitis: Secondary | ICD-10-CM

## 2014-09-14 DIAGNOSIS — E669 Obesity, unspecified: Secondary | ICD-10-CM

## 2014-09-14 DIAGNOSIS — Q394 Esophageal web: Secondary | ICD-10-CM | POA: Diagnosis not present

## 2014-09-14 DIAGNOSIS — E785 Hyperlipidemia, unspecified: Secondary | ICD-10-CM

## 2014-09-14 DIAGNOSIS — R0789 Other chest pain: Secondary | ICD-10-CM | POA: Diagnosis not present

## 2014-09-14 DIAGNOSIS — K222 Esophageal obstruction: Secondary | ICD-10-CM

## 2014-09-14 NOTE — Patient Instructions (Signed)
Your physician has requested that you have an exercise tolerance test. For further information please visit [email or web address]. Please also follow instruction sheet, as given.   Your physician recommends that you schedule a follow-up appointment Pending treadmill results.

## 2014-09-16 ENCOUNTER — Encounter: Payer: Self-pay | Admitting: Cardiovascular Disease

## 2014-09-16 DIAGNOSIS — K219 Gastro-esophageal reflux disease without esophagitis: Secondary | ICD-10-CM | POA: Insufficient documentation

## 2014-09-16 DIAGNOSIS — E669 Obesity, unspecified: Secondary | ICD-10-CM | POA: Insufficient documentation

## 2014-09-16 DIAGNOSIS — E785 Hyperlipidemia, unspecified: Secondary | ICD-10-CM | POA: Insufficient documentation

## 2014-09-16 DIAGNOSIS — K222 Esophageal obstruction: Secondary | ICD-10-CM | POA: Insufficient documentation

## 2014-09-16 DIAGNOSIS — R0789 Other chest pain: Secondary | ICD-10-CM | POA: Insufficient documentation

## 2014-09-16 NOTE — Progress Notes (Signed)
Patient ID: Sean Dickson, male   DOB: 1965-07-17, 49 y.o.   MRN: [id number]     Referring MD: Dr. Shirlyn Goltz  PATIENT PROFILE: Sean Dickson is a 49 y.o. male who is referred through the courtesy of Dr. Darl Householder for evaluation of chest pain.   HPI:  Sean Dickson has a history of GERD, hyperlipidemia, as well as a history of a Schatzki's ring with intermittent epigastric pain.  He recently was evaluated in the emergency room after presenting on 09/05/2014 with sudden onset of epigastric/chest pain.  He felt it was worse than his usual reflux and was described as a burning sensation which ultimately resolved.  Reportedly, he had a normal stress test approximately 6 years ago.  There is no history of tobacco use.  There is family history for myocardial infarction in his grandfather.  He denies any definitive exertional chest pain.  His recent chest pain episode occurred while eating.  He works long hours with [employer].  He denies any exertional precipitation.  Several weeks previously had ridden his bike without difficulty.  He has a history of mild hyperlipidemia and currently is on fenofibrate with reported intolerance to pravastatin, atorvastatin, and red yeast rice.   Because of his recent emergency room evaluation, he is now referred for Cardiologic assessment.  Past Medical History  Diagnosis Date  . GERD (gastroesophageal reflux disease)     schatzki ring  . Hypercholesteremia   . IBS (irritable bowel syndrome)   . Esophageal spasm   . Schatzki's ring   . Allergic rhinitis   . Kidney stone   . Anxiety state, unspecified     with attacks  . Anxiety attack   . History of panic attacks   . Atypical chest pain   . H/O adenomatous polyp of colon     Past Surgical History  Procedure Laterality Date  . Inguinal hernia repair Right 2005  . Tonsillectomy    . Wisdom tooth extraction      Allergies  Allergen Reactions  . Prozac [Fluoxetine] Other (See Comments)    Psychologically Numb  .  Nitroglycerin Other (See Comments)    Headaches  . Atorvastatin Other (See Comments)    GERD, Myalgias  . Codeine     Stomach upset   . Pravastatin Other (See Comments)    Myalgias  . Red Yeast Rice [Cholestin] Other (See Comments)    GERD     Current Outpatient Prescriptions  Medication Sig Dispense Refill  . fenofibrate 160 MG tablet     . fluticasone (FLONASE) 50 MCG/ACT nasal spray Place 1 spray into the nose as needed.    . pantoprazole (PROTONIX) 40 MG tablet Take 40 mg by mouth daily.     No current facility-administered medications for this visit.    Social History   Social History  . Marital Status: Married    Spouse Name: N/A  . Number of Children: N/A  . Years of Education: N/A   Occupational History  . Not on file.   Social History Main Topics  . Smoking status: Never Smoker   . Smokeless tobacco: Never Used  . Alcohol Use: No  . Drug Use: No  . Sexual Activity: Not on file   Other Topics Concern  . Not on file   Social History Narrative   Additional social history is notable fo  completing 12th grade of education.  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  He is married for 25  years.  He has 2 children.  There is no tobacco use.  Is no alcohol use.  He does not routinely exercise.  Family History  Problem Relation Age of Onset  . Non-Dickson's lymphoma Father   . Other Mother     palpitations  . Macular degeneration Mother   . Uterine cancer Sister   . Colon polyps Sister 54    4 adenoma   Family history is notable in that both parents are alive, mother at 66 who has a history of palpitations.  His father is 36.  He has one sister age 39.  ROS General: Negative; No fevers, chills, or night sweats HEENT: Negative; No changes in vision or hearing, sinus congestion, difficulty swallowing Pulmonary: Negative; No cough, wheezing, shortness of breath, hemoptysis Cardiovascular:  See HPI; No chest pain, presyncope, syncope, palpitations, edema GI:  Negative; No nausea, vomiting, diarrhea, or abdominal pain GU: Positive for GERD Musculoskeletal: Negative; no myalgias, joint pain, or weakness Hematologic/Oncologic: Negative; no easy bruising, bleeding Endocrine: Negative; no heat/cold intolerance; no diabetes Neuro: Negative; no changes in balance, headaches Skin: Negative; No rashes or skin lesions Psychiatric: Negative; No behavioral problems, depression Sleep: Negative; No daytime sleepiness, hypersomnolence, bruxism, restless legs, hypnogagnic hallucinations Other comprehensive 14 point system review is negative   Physical Exam BP 158/84 mmHg  Pulse 78  Ht 6' 4"  (1.93 m)  Wt 289 lb 12.8 oz (131.452 kg)  BMI 35.29 kg/m2  Repeat blood pressure by me was 128/84  Wt Readings from Last 3 Encounters:  09/14/14 289 lb 12.8 oz (131.452 kg)  09/05/14 290 lb (131.543 kg)  01/17/13 280 lb (127.007 kg)   General: Alert, oriented, no distress.  Skin: normal turgor, no rashes, warm and dry HEENT: Normocephalic, atraumatic. Pupils equal round and reactive to light; sclera anicteric; extraocular muscles intact; Fundi without hemorrhages or exudates Nose without nasal septal hypertrophy Mouth/Parynx benign; Mallinpatti scale 3 Neck: No JVD, no carotid bruits; normal carotid upstroke Lungs: clear to ausculatation and percussion; no wheezing or rales Chest wall: without tenderness to palpitation Heart: PMI not displaced, RRR, s1 s2 normal, 1/6 systolic murmur, no diastolic murmur, no rubs, gallops, thrills, or heaves Abdomen: soft, nontender; no hepatosplenomehaly, BS+; abdominal aorta nontender and not dilated by palpation. Back: no CVA tenderness Pulses 2+ Musculoskeletal: full range of motion, normal strength, no joint deformities Extremities: no clubbing cyanosis or edema, Homan's sign negative  Neurologic: grossly nonfocal; Cranial nerves grossly wnl Psychologic: Normal mood and affect   ECG (independently read by me): Normal  sinus rhythm at 78.  Borderline first-degree AV block with a PR interval at 206 ms.  Normal intervals.  No ST segment changes.  LABS:  BMP Latest Ref Rng 09/05/2014 01/04/2011 10/29/2006  Glucose 65 - 99 mg/dL 115(H) 104(H) 126(H)  BUN 6 - 20 mg/dL 27(H) 20 16  Creatinine 0.61 - 1.24 mg/dL 1.32(H) 1.00 1.23  Sodium 135 - 145 mmol/L 139 139 138  Potassium 3.5 - 5.1 mmol/L 3.7 4.3 3.9  Chloride 101 - 111 mmol/L 103 101 100  CO2 22 - 32 mmol/L 27 29 28   Calcium 8.9 - 10.3 mg/dL 9.5 10.0 9.4     Hepatic Function Latest Ref Rng 09/05/2014  Total Protein 6.5 - 8.1 g/dL 7.5  Albumin 3.5 - 5.0 g/dL 4.6  AST 15 - 41 U/L 25  ALT 17 - 63 U/L 20  Alk Phosphatase 38 - 126 U/L 37(L)  Total Bilirubin 0.3 - 1.2 mg/dL 0.7    CBC Latest  Ref Rng 09/05/2014 01/04/2011 10/29/2006  WBC 4.0 - 10.5 K/uL 5.2 5.4 6.4  Hemoglobin 13.0 - 17.0 g/dL 13.8 14.2 14.3  Hematocrit 39.0 - 52.0 % 42.0 41.9 41.8  Platelets 150 - 400 K/uL 261 233 252   Lab Results  Component Value Date   MCV 93.1 09/05/2014   MCV 90.7 01/04/2011   MCV 91.2 10/29/2006   No results found for: TSH No results found for: HGBA1C   BNP No results found for: BNP  ProBNP    Component Value Date/Time   PROBNP 80.9 01/04/2011 1405     Lipid Panel  No results found for: CHOL, TRIG, HDL, CHOLHDL, VLDL, LDLCALC, LDLDIRECT  RADIOLOGY: Dg Chest 2 View  09/05/2014   CLINICAL DATA:  Chest pain  EXAM: CHEST  2 VIEW  COMPARISON:  01/04/2011  FINDINGS: Cardiomediastinal silhouette is stable. No acute infiltrate or pleural effusion. No pulmonary edema. Mild degenerative changes thoracic spine.  IMPRESSION: No active cardiopulmonary disease.   Electronically Signed   By: Lahoma Crocker M.D.   On: 09/05/2014 22:11     ASSESSMENT AND PLAN: Sean Dickson is a 49 year old gentleman who has a history of mild obesity with a body mass index of 35.29 kg/m, as well as a history of GERD, hyperlipidemia, as well as a Schatzki ring.  He had recently  developed chest pain with eating, which was more intense than his reflux pain which precipitated his emergency room evaluation.  I did review his laboratory from the emergency room.  He denies any exertional symptomatology.  He has remained relatively active with his work requirements and denies any associated work-related chest pain.  Initial blood pressure was mildly elevated, but this improved on repeat testing.  He has intolerance to atorvastatin, pravastatin, as well as red use rice to but has been taking fenofibrate for lipid treatment.  He also takes pantoprazole for his GERD.  Presently, I am recommending that we obtain a 2-D echo Doppler study to evaluate systolic and diastolic function.  We discussed the importance of weight loss and exercise.  I am scheduling him for a routine graded exercise treadmill test for CAD screening.  I will see him in follow-up of his treadmill study, the penny upon the results.  I suspect his chest pain most likely is GI in etiology.   Troy Sine, MD, [facility] 09/16/2014 2:32 PM

## 2014-10-05 ENCOUNTER — Telehealth (HOSPITAL_COMMUNITY): Payer: Self-pay

## 2014-10-05 NOTE — Telephone Encounter (Signed)
Encounter complete. 

## 2014-10-06 ENCOUNTER — Telehealth (HOSPITAL_COMMUNITY): Payer: Self-pay

## 2014-10-06 NOTE — Telephone Encounter (Signed)
Encounter complete. 

## 2014-10-10 ENCOUNTER — Ambulatory Visit (HOSPITAL_COMMUNITY)
Admission: RE | Admit: 2014-10-10 | Discharge: 2014-10-10 | Disposition: A | Discharge status: Home / Self Care | Payer: 59 | Source: Ambulatory Visit | Attending: Cardiology | Admitting: Cardiology

## 2014-10-10 DIAGNOSIS — R0789 Other chest pain: Secondary | ICD-10-CM

## 2014-10-10 LAB — EXERCISE TOLERANCE TEST
CHL CUP STRESS STAGE 1 GRADE: 0 %
CHL CUP STRESS STAGE 1 HR: 95 {beats}/min
CHL CUP STRESS STAGE 1 SBP: 120 mmHg
CHL CUP STRESS STAGE 4 DBP: 65 mmHg
CHL CUP STRESS STAGE 4 GRADE: 10 %
CHL CUP STRESS STAGE 4 SBP: 141 mmHg
CHL CUP STRESS STAGE 4 SPEED: 1.7 mph
CHL CUP STRESS STAGE 5 GRADE: 12 %
CHL CUP STRESS STAGE 5 HR: 136 {beats}/min
CHL CUP STRESS STAGE 5 SBP: 149 mmHg
CHL CUP STRESS STAGE 6 DBP: 71 mmHg
CHL CUP STRESS STAGE 6 HR: 150 {beats}/min
CHL CUP STRESS STAGE 6 SBP: 167 mmHg
CHL CUP STRESS STAGE 7 DBP: 69 mmHg
CHL CUP STRESS STAGE 7 GRADE: 0 %
CHL CUP STRESS STAGE 8 DBP: 84 mmHg
CSEPHR: 87 %
CSEPPMHR: 87 %
Estimated workload: 9.9 METS
Exercise duration (min): 7 min
Exercise duration (sec): 55 s
MPHR: 172 {beats}/min
Peak BP: 167 mmHg
Peak HR: 150 {beats}/min
RPE: 16
Rest HR: 86 {beats}/min
Stage 1 DBP: 83 mmHg
Stage 1 Speed: 0 mph
Stage 2 Grade: 0 %
Stage 2 HR: 94 {beats}/min
Stage 2 Speed: 1 mph
Stage 3 Grade: 0.2 %
Stage 3 HR: 93 {beats}/min
Stage 3 Speed: 1 mph
Stage 4 HR: 118 {beats}/min
Stage 5 DBP: 72 mmHg
Stage 5 Speed: 2.5 mph
Stage 6 Grade: 14 %
Stage 6 Speed: 3.4 mph
Stage 7 HR: 123 {beats}/min
Stage 7 SBP: 140 mmHg
Stage 7 Speed: 0 mph
Stage 8 Grade: 0 %
Stage 8 HR: 89 {beats}/min
Stage 8 SBP: 118 mmHg
Stage 8 Speed: 0 mph

## 2014-10-16 ENCOUNTER — Encounter: Payer: Self-pay | Admitting: *Deleted

## 2015-01-09 ENCOUNTER — Encounter: Payer: Self-pay | Admitting: Podiatry

## 2015-01-09 ENCOUNTER — Ambulatory Visit (INDEPENDENT_AMBULATORY_CARE_PROVIDER_SITE_OTHER): Payer: 59 | Admitting: Podiatry

## 2015-01-09 VITALS — BP 140/85 | HR 76 | Resp 16

## 2015-01-09 DIAGNOSIS — B070 Plantar wart: Secondary | ICD-10-CM

## 2015-01-09 MED ORDER — FLUOROURACIL 5 % EX CREA: TOPICAL_CREAM | Freq: Two times a day (BID) | CUTANEOUS | Status: DC

## 2015-01-09 NOTE — Progress Notes (Signed)
He presents today with a chief complaint of a painful lesion plantar aspect of his left heel. He states his been there for the past few weeks I think he may have another wart.  Objective: Vital signs are stable he is alert and oriented 3 no changes in his past medical history medications allergies surgery social history or review of systems. Pulses are palpable left foot. Plantar aspect of left heel does reveal a 3 mm verrucoid lesion with thrombosed capillaries.  Assessment verruca plantaris left heel.  Plan: Chemical destruction of the lesion today and application of Efudex twice daily starting tomorrow. I will follow-up with him in 6 weeks.

## 2015-01-28 DIAGNOSIS — G709 Myoneural disorder, unspecified: Secondary | ICD-10-CM

## 2015-01-28 HISTORY — DX: Myoneural disorder, unspecified: G70.9

## 2015-02-20 ENCOUNTER — Ambulatory Visit (INDEPENDENT_AMBULATORY_CARE_PROVIDER_SITE_OTHER): Payer: 59 | Admitting: Podiatry

## 2015-02-20 ENCOUNTER — Encounter: Payer: Self-pay | Admitting: Podiatry

## 2015-02-20 VITALS — BP 135/96 | HR 89 | Resp 16

## 2015-02-20 DIAGNOSIS — B070 Plantar wart: Secondary | ICD-10-CM | POA: Diagnosis not present

## 2015-02-20 NOTE — Progress Notes (Signed)
He presents today for follow-up of the wart plantar aspect of his left heel. He states that it doesn't hurt any longer. He continues to apply Efudex cream under occlusion twice daily.    Objective: vital signs are stable alert and oriented 3. Pulses are palpable. Wart to the plantar aspect of the left heel is much decreased upon removal of digits reactive hyperkeratotic tissue the wart appears to be very small in size. I do think that it is nearly resolved at this point.  Assessment: wart plantar aspect of the left heel.  Plan: I encouraged him to continue his daily regimen with Efudex cream and I will follow-up with him in 6 weeks.

## 2015-04-03 ENCOUNTER — Ambulatory Visit (INDEPENDENT_AMBULATORY_CARE_PROVIDER_SITE_OTHER): Payer: 59 | Admitting: Podiatry

## 2015-04-03 ENCOUNTER — Encounter: Payer: Self-pay | Admitting: Podiatry

## 2015-04-03 VITALS — BP 126/74 | HR 84 | Resp 16

## 2015-04-03 DIAGNOSIS — B070 Plantar wart: Secondary | ICD-10-CM | POA: Diagnosis not present

## 2015-04-04 NOTE — Progress Notes (Signed)
He presents today for follow-up of wart plantar aspect of the left heel. He states that I think it's gone.  Objective: Vital signs are stable he is alert and oriented 3. Pulses are palpable. I'm able after wiping the area with alcohol see just a very small lesion remaining.  Assessment: I encouraged him to continue Efudex cream for another month and I will follow-up with him in 6 weeks.

## 2015-06-05 ENCOUNTER — Ambulatory Visit: Payer: 59 | Admitting: Podiatry

## 2015-06-07 ENCOUNTER — Ambulatory Visit: Payer: 59 | Admitting: Podiatry

## 2015-06-19 ENCOUNTER — Ambulatory Visit (INDEPENDENT_AMBULATORY_CARE_PROVIDER_SITE_OTHER): Payer: 59 | Admitting: Podiatry

## 2015-06-19 ENCOUNTER — Encounter: Payer: Self-pay | Admitting: Podiatry

## 2015-06-19 VITALS — BP 110/77 | HR 82 | Resp 12

## 2015-06-19 DIAGNOSIS — B070 Plantar wart: Secondary | ICD-10-CM

## 2015-06-20 NOTE — Progress Notes (Signed)
He presents today for follow-up of a wart plantar aspect of his left heel he states the medication I can feel something in there I stepped wrong but other than that it feels pretty good. He states that he does not see any other warts on the plantar aspect of either foot.  Objective: Vital signs are stable he is alert and oriented 3 have reviewed his past medical history medications and allergies. Pulses are strongly palpable. Plantar aspect of the bilateral foot does not demonstrate any type of verrucoid or soft lesion. Does however demonstrate what appears to be scar tissue deep to the epidermis plantar medial aspect of the left heel where the wart was removed.  Assessment: Well-healed verruca plantaris.  Plan: Follow-up with [place] as needed. Continue to watch the feet regularly to make sure that we do not have a recurrence.

## 2015-11-15 ENCOUNTER — Other Ambulatory Visit: Payer: Self-pay | Admitting: Neurosurgery

## 2015-11-15 DIAGNOSIS — M4802 Spinal stenosis, cervical region: Secondary | ICD-10-CM

## 2015-11-19 ENCOUNTER — Ambulatory Visit
Admission: RE | Admit: 2015-11-19 | Discharge: 2015-11-19 | Disposition: A | Discharge status: Home / Self Care | Payer: 59 | Source: Ambulatory Visit | Attending: Neurosurgery | Admitting: Neurosurgery

## 2015-11-19 DIAGNOSIS — M4802 Spinal stenosis, cervical region: Secondary | ICD-10-CM

## 2015-11-23 ENCOUNTER — Other Ambulatory Visit: Payer: 59

## 2016-02-13 ENCOUNTER — Ambulatory Visit (INDEPENDENT_AMBULATORY_CARE_PROVIDER_SITE_OTHER): Payer: 59 | Admitting: Orthopaedic Surgery

## 2016-02-18 ENCOUNTER — Encounter (INDEPENDENT_AMBULATORY_CARE_PROVIDER_SITE_OTHER): Payer: Self-pay

## 2016-02-18 ENCOUNTER — Encounter (INDEPENDENT_AMBULATORY_CARE_PROVIDER_SITE_OTHER): Payer: Self-pay | Admitting: Physician Assistant

## 2016-02-18 ENCOUNTER — Ambulatory Visit (INDEPENDENT_AMBULATORY_CARE_PROVIDER_SITE_OTHER): Payer: 59

## 2016-02-18 ENCOUNTER — Ambulatory Visit (INDEPENDENT_AMBULATORY_CARE_PROVIDER_SITE_OTHER): Payer: 59 | Admitting: Physician Assistant

## 2016-02-18 DIAGNOSIS — M25561 Pain in right knee: Secondary | ICD-10-CM

## 2016-02-18 NOTE — Progress Notes (Signed)
   Office Visit Note   Patient: Sean Dickson           Date of Birth: 09/23/1965           MRN: [id number] Visit Date: 02/18/2016              Requested by: Gaynelle Arabian, MD [address] [place],  [id number] PCP: Simona Huh, MD   Assessment & Plan: Visit Diagnoses:  1. Acute pain of right knee     Plan: Offered cortisone injection right knee today he defers. Quad strengthening discussed. Natural anti-inflammatories discussed with patient. When asked not to take any NSAIDs by his spine surgeon will ask about these natural anti-inflammatories prior to taking them. May consider cortisone injection in the future .  Follow-Up Instructions: Return if symptoms worsen or fail to improve.   Orders:  Orders Placed This Encounter  Procedures  . XR Knee 1-2 Views Right   No orders of the defined types were placed in this encounter.     Procedures: No procedures performed   Clinical Data: No additional findings.   Subjective: Chief Complaint  Patient presents with  . Right Knee - Pain    HPI  Review of Systems   Objective: Vital Signs: There were no vitals taken for this visit.  Physical Exam  Constitutional: He is oriented to person, place, and time. He appears well-developed and well-nourished. No distress.  Pulmonary/Chest: Effort normal.  Neurological: He is alert and oriented to person, place, and time.    Ortho Exam Good range of motion bilateral knees no effusion no abnormal warmth erythema. No instability with valgus varus stressing. Tenderness medial joint line of both knees. McMurray's bilaterally Specialty Comments:  No specialty comments available.  Imaging: Xr Knee 1-2 Views Right  Result Date: 02/18/2016 Bilateral knees AP and lateral: No acute fracture, moderate narrowing medial joint line bilaterally. Otherwise no bony abnormalities.    PMFS History: Patient Active Problem List   Diagnosis Date Noted  .  Hyperlipidemia 09/16/2014  . GERD (gastroesophageal reflux disease) 09/16/2014  . Schatzki's ring 09/16/2014  . Atypical chest pain 09/16/2014  . Mild obesity 09/16/2014   Past Medical History:  Diagnosis Date  . Allergic rhinitis   . Anxiety attack   . Anxiety state, unspecified    with attacks  . Atypical chest pain   . Esophageal spasm   . GERD (gastroesophageal reflux disease)    schatzki ring  . H/O adenomatous polyp of colon   . History of panic attacks   . Hypercholesteremia   . IBS (irritable bowel syndrome)   . Kidney stone   . Schatzki's ring     Family History  Problem Relation Age of Onset  . Non-Hodgkin's lymphoma Father   . Other Mother     palpitations  . Macular degeneration Mother   . Uterine cancer Sister   . Colon polyps Sister 72    4 adenoma    Past Surgical History:  Procedure Laterality Date  . INGUINAL HERNIA REPAIR Right 2005  . TONSILLECTOMY    . WISDOM TOOTH EXTRACTION     Social History   Occupational History  . Not on file.   Social History Main Topics  . Smoking status: Never Smoker  . Smokeless tobacco: Never Used  . Alcohol use No  . Drug use: No  . Sexual activity: Not on file

## 2016-03-26 DIAGNOSIS — M5412 Radiculopathy, cervical region: Secondary | ICD-10-CM | POA: Diagnosis not present

## 2016-03-26 DIAGNOSIS — M4712 Other spondylosis with myelopathy, cervical region: Secondary | ICD-10-CM | POA: Diagnosis not present

## 2016-03-26 DIAGNOSIS — M542 Cervicalgia: Secondary | ICD-10-CM | POA: Diagnosis not present

## 2016-06-09 DIAGNOSIS — E039 Hypothyroidism, unspecified: Secondary | ICD-10-CM | POA: Diagnosis not present

## 2016-06-09 DIAGNOSIS — N289 Disorder of kidney and ureter, unspecified: Secondary | ICD-10-CM | POA: Diagnosis not present

## 2016-06-09 DIAGNOSIS — J309 Allergic rhinitis, unspecified: Secondary | ICD-10-CM | POA: Diagnosis not present

## 2016-06-09 DIAGNOSIS — E7800 Pure hypercholesterolemia, unspecified: Secondary | ICD-10-CM | POA: Diagnosis not present

## 2016-06-09 DIAGNOSIS — K219 Gastro-esophageal reflux disease without esophagitis: Secondary | ICD-10-CM | POA: Diagnosis not present

## 2016-06-13 ENCOUNTER — Other Ambulatory Visit: Payer: Self-pay | Admitting: Physician Assistant

## 2016-06-13 DIAGNOSIS — R972 Elevated prostate specific antigen [PSA]: Secondary | ICD-10-CM | POA: Diagnosis not present

## 2016-06-13 DIAGNOSIS — R1084 Generalized abdominal pain: Secondary | ICD-10-CM

## 2016-06-13 DIAGNOSIS — R1031 Right lower quadrant pain: Secondary | ICD-10-CM | POA: Diagnosis not present

## 2016-06-16 ENCOUNTER — Ambulatory Visit
Admission: RE | Admit: 2016-06-16 | Discharge: 2016-06-16 | Disposition: A | Discharge status: Home / Self Care | Payer: 59 | Source: Ambulatory Visit | Attending: Physician Assistant | Admitting: Physician Assistant

## 2016-06-16 DIAGNOSIS — N289 Disorder of kidney and ureter, unspecified: Secondary | ICD-10-CM | POA: Diagnosis not present

## 2016-06-16 DIAGNOSIS — R1084 Generalized abdominal pain: Secondary | ICD-10-CM

## 2016-06-16 MED ORDER — IOPAMIDOL (ISOVUE-300) INJECTION 61%
125.0000 mL | Freq: Once | INTRAVENOUS | Status: AC | PRN
  Administered 2016-06-16: 125 mL via INTRAVENOUS

## 2016-06-19 ENCOUNTER — Other Ambulatory Visit: Payer: Self-pay | Admitting: Physician Assistant

## 2016-06-19 DIAGNOSIS — N289 Disorder of kidney and ureter, unspecified: Secondary | ICD-10-CM

## 2016-06-24 DIAGNOSIS — D49511 Neoplasm of unspecified behavior of right kidney: Secondary | ICD-10-CM | POA: Diagnosis not present

## 2016-06-24 DIAGNOSIS — Z87442 Personal history of urinary calculi: Secondary | ICD-10-CM | POA: Diagnosis not present

## 2016-06-24 DIAGNOSIS — R972 Elevated prostate specific antigen [PSA]: Secondary | ICD-10-CM | POA: Diagnosis not present

## 2016-06-29 ENCOUNTER — Other Ambulatory Visit: Payer: 59

## 2016-07-15 DIAGNOSIS — R972 Elevated prostate specific antigen [PSA]: Secondary | ICD-10-CM | POA: Diagnosis not present

## 2016-07-15 DIAGNOSIS — N281 Cyst of kidney, acquired: Secondary | ICD-10-CM | POA: Diagnosis not present

## 2016-07-16 DIAGNOSIS — M542 Cervicalgia: Secondary | ICD-10-CM | POA: Diagnosis not present

## 2016-07-16 DIAGNOSIS — M4712 Other spondylosis with myelopathy, cervical region: Secondary | ICD-10-CM | POA: Diagnosis not present

## 2016-08-12 DIAGNOSIS — E038 Other specified hypothyroidism: Secondary | ICD-10-CM | POA: Diagnosis not present

## 2016-10-16 DIAGNOSIS — E038 Other specified hypothyroidism: Secondary | ICD-10-CM | POA: Diagnosis not present

## 2016-11-24 ENCOUNTER — Other Ambulatory Visit: Payer: Self-pay | Admitting: Neurosurgery

## 2016-11-24 DIAGNOSIS — M5412 Radiculopathy, cervical region: Secondary | ICD-10-CM

## 2016-12-03 ENCOUNTER — Ambulatory Visit
Admission: RE | Admit: 2016-12-03 | Discharge: 2016-12-03 | Disposition: A | Discharge status: Home / Self Care | Payer: 59 | Source: Ambulatory Visit | Attending: Neurosurgery | Admitting: Neurosurgery

## 2016-12-03 DIAGNOSIS — M4802 Spinal stenosis, cervical region: Secondary | ICD-10-CM | POA: Diagnosis not present

## 2016-12-03 DIAGNOSIS — M5412 Radiculopathy, cervical region: Secondary | ICD-10-CM

## 2016-12-08 DIAGNOSIS — M5412 Radiculopathy, cervical region: Secondary | ICD-10-CM | POA: Diagnosis not present

## 2016-12-08 DIAGNOSIS — M4712 Other spondylosis with myelopathy, cervical region: Secondary | ICD-10-CM | POA: Diagnosis not present

## 2016-12-08 DIAGNOSIS — M542 Cervicalgia: Secondary | ICD-10-CM | POA: Diagnosis not present

## 2016-12-11 DIAGNOSIS — E7800 Pure hypercholesterolemia, unspecified: Secondary | ICD-10-CM | POA: Diagnosis not present

## 2016-12-11 DIAGNOSIS — N289 Disorder of kidney and ureter, unspecified: Secondary | ICD-10-CM | POA: Diagnosis not present

## 2016-12-11 DIAGNOSIS — K219 Gastro-esophageal reflux disease without esophagitis: Secondary | ICD-10-CM | POA: Diagnosis not present

## 2016-12-11 DIAGNOSIS — J309 Allergic rhinitis, unspecified: Secondary | ICD-10-CM | POA: Diagnosis not present

## 2016-12-11 DIAGNOSIS — E039 Hypothyroidism, unspecified: Secondary | ICD-10-CM | POA: Diagnosis not present

## 2016-12-15 ENCOUNTER — Encounter: Payer: Self-pay | Admitting: Neurology

## 2017-01-09 DIAGNOSIS — R972 Elevated prostate specific antigen [PSA]: Secondary | ICD-10-CM | POA: Diagnosis not present

## 2017-01-13 DIAGNOSIS — R972 Elevated prostate specific antigen [PSA]: Secondary | ICD-10-CM | POA: Diagnosis not present

## 2017-03-13 DIAGNOSIS — D229 Melanocytic nevi, unspecified: Secondary | ICD-10-CM | POA: Diagnosis not present

## 2017-03-13 DIAGNOSIS — L720 Epidermal cyst: Secondary | ICD-10-CM | POA: Diagnosis not present

## 2017-03-23 ENCOUNTER — Ambulatory Visit (INDEPENDENT_AMBULATORY_CARE_PROVIDER_SITE_OTHER): Payer: 59 | Admitting: Neurology

## 2017-03-23 ENCOUNTER — Encounter: Payer: Self-pay | Admitting: Neurology

## 2017-03-23 VITALS — BP 130/80 | HR 79 | Ht 76.0 in | Wt 295.1 lb

## 2017-03-23 DIAGNOSIS — R519 Headache, unspecified: Secondary | ICD-10-CM

## 2017-03-23 DIAGNOSIS — R292 Abnormal reflex: Secondary | ICD-10-CM

## 2017-03-23 DIAGNOSIS — R51 Headache: Secondary | ICD-10-CM

## 2017-03-23 DIAGNOSIS — R29818 Other symptoms and signs involving the nervous system: Secondary | ICD-10-CM | POA: Diagnosis not present

## 2017-03-23 NOTE — Patient Instructions (Signed)
We will order MRI head and MRA head to evaluate your symptoms.

## 2017-03-23 NOTE — Progress Notes (Signed)
[name] Clinic Note - Initial Visit   Date: 03/23/17  FAIRLEY COPHER MRN: [id number] DOB: 10-Jan-1966   Dear Dr. Vertell Limber:  Thank you for your kind referral of Sean Dickson for consultation of left hemibody sensory changes. Although his history is well known to you, please allow [place] to reiterate it for the purpose of our medical record. The patient was accompanied to the clinic by self.   History of Present Illness: Sean Dickson is a 52 y.o. right-handed Caucasian male with hyperlipidemia, hypothyroidism, GERD, and s/p ACDF at C5-C7 (2017) presenting for evaluation of left sided skin disturbance.  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   Starting around 10 years ago, he began experiencing different sensation of left face, arm, and leg occurring about once every 2 months, lasting 2-3 days.  He does not have numbness, tingling, or burning sensation.  There is no pain.  There is very subtle change that he feels that his hand and leg movements take more effort, but there is no frank weakness. Cold temperatures makes him much more sensitivity to these changes.  He occasionally has headaches, over the left parietal region.  There is no nausea, photophobia, or phonophobia.  Headaches are more annoying, than throbbing or pounding.  He typically does not have to treat the headaches, when necessary, he takes tylenol which helps headaches and sometimes also the abnormal sensation.  There is no associated dysarthria, dysphagia.    He had MRI brain in 2010 for these symptoms which showed no clear findings to explain symptoms, there was some mass effects at the cervicomedullary junction and left medulla due to tortuosity of the distal left vertebral artery.  He saw Dr. Maureen Chatters at [facility] who ordered NCS/EMG of the left side which showed mild left carpal tunnel.  He continues to have these spells every 2-3 months, however in November, the spell was more intense and bothersome and  he mentioned it to Dr. Vertell Limber who referred him to see me for evaluation.   Out-side paper records, electronic medical record, and images have been reviewed where available and summarized as:  MRI cervical spine wo contrast 12/03/2016:   1. Prior ACDF at C5 through C7 without residual canal stenosis. Residual C6 and C7 foraminal stenosis as above, improved from preoperative MRI. 2. Mild disc bulge with uncovertebral hypertrophy at C4-5 with resultant borderline spinal stenosis and moderate bilateral C5 foraminal narrowing, not significantly changed from previous. 3. Bilateral facet hypertrophy at C7-T1 with resultant mild bilateral C8 foraminal stenosis, slightly worse on the left.  MRI brain wwo contrast 05/16/2008: 1. No acute intracranial abnormality. 2.  Mass effect on the cervicomedullary junction and left medulla without edema is related to tortuosity of the distal left vertebral artery.  Past Medical History:  Diagnosis Date  . Allergic rhinitis   . Anxiety attack   . Anxiety state, unspecified    with attacks  . Atypical chest pain   . Esophageal spasm   . GERD (gastroesophageal reflux disease)    schatzki ring  . H/O adenomatous polyp of colon   . History of panic attacks   . Hypercholesteremia   . IBS (irritable bowel syndrome)   . Kidney stone   . Schatzki's ring     Past Surgical History:  Procedure Laterality Date  . anterior cervical repair    . INGUINAL HERNIA REPAIR Right 2005  . TONSILLECTOMY    . WISDOM TOOTH EXTRACTION  Medications:  Outpatient Encounter Medications as of 03/23/2017  Medication Sig Note  . fenofibrate 160 MG tablet  11/23/2012: Received from: External Pharmacy  . fluorouracil (EFUDEX) 5 % cream Apply topically 2 (two) times daily.   . fluticasone (FLONASE) 50 MCG/ACT nasal spray Place 1 spray into the nose as needed. 09/14/2014: Received from: [facility]: Place 1 spray into the nose.  . levothyroxine (SYNTHROID,  LEVOTHROID) 75 MCG tablet TAKE 1 TABLET ON AN EMPTY STOMACH ONCE A DAY ORALLY   . Multiple Vitamins-Minerals (CENTRUM SILVER PO) Take by mouth.   [name] omeprazole (PRILOSEC) 40 MG capsule TAKE 1 CAPSULE ONCE A DAY FOR ACID REFLUX ORALLY   . OVER THE COUNTER MEDICATION Tumeric 538   . [DISCONTINUED] pantoprazole (PROTONIX) 40 MG tablet Take 40 mg by mouth daily.    No facility-administered encounter medications on file as of 03/23/2017.      Allergies:  Allergies  Allergen Reactions  . Prozac [Fluoxetine] Other (See Comments)    Psychologically Numb  . Nitroglycerin Other (See Comments)    Headaches  . Atorvastatin Other (See Comments)    GERD, Myalgias  . Codeine     Stomach upset   . Pravastatin Other (See Comments)    Myalgias  . Red Yeast Rice [Cholestin] Other (See Comments)    GERD     Family History: Family History  Problem Relation Age of Onset  . Other Mother        palpitations  . Macular degeneration Mother   . Non-Hodgkin's lymphoma Father   . Uterine cancer Sister   . Colon polyps Sister 70       4 adenoma    Social History: Social History   Tobacco Use  . Smoking status: Never Smoker  . Smokeless tobacco: Never Used  Substance Use Topics  . Alcohol use: No    Alcohol/week: 0.0 oz  . Drug use: No   Social History   Social History Narrative   Lives with wife and 2 daughters in a one story home.     Works as a Clinical cytogeneticist for [employer].     Education: high school.     Review of Systems:  CONSTITUTIONAL: No fevers, chills, night sweats, or weight loss.   EYES: No visual changes or eye pain ENT: No hearing changes.  No history of nose bleeds.   RESPIRATORY: No cough, wheezing and shortness of breath.   CARDIOVASCULAR: Negative for chest pain, and palpitations.   GI: Negative for abdominal discomfort, blood in stools or black stools.  No recent change in bowel habits.   GU:  No history of incontinence.   MUSCLOSKELETAL: No history of joint pain or  swelling.  No myalgias.   SKIN: Negative for lesions, rash, and itching.   HEMATOLOGY/ONCOLOGY: Negative for prolonged bleeding, bruising easily, and swollen nodes.  No history of cancer.   ENDOCRINE: Negative for cold or heat intolerance, polydipsia or goiter.   PSYCH:  No depression or anxiety symptoms.   NEURO: As Above.   Vital Signs:  BP 130/80   Pulse 79   Ht 6\' 4"  (1.93 m)   Wt 295 lb 2 oz (133.9 kg)   SpO2 97%   BMI 35.92 kg/m  Pain Scale: 0 on a scale of 0-10   General Medical Exam:   General:  Well appearing, comfortable.   Eyes/ENT: see cranial nerve examination.   Neck: No masses appreciated.  Full range of motion without tenderness.  No carotid bruits.  Respiratory:  Clear to auscultation, good air entry bilaterally.   Cardiac:  Regular rate and rhythm, no murmur.   Extremities:  No deformities, edema, or skin discoloration.  Skin:  No rashes or lesions.  Neurological Exam: MENTAL STATUS including orientation to time, place, person, recent and remote memory, attention span and concentration, language, and fund of knowledge is normal.  Speech is not dysarthric.  CRANIAL NERVES: II:  No visual field defects.  Unremarkable fundi.   III-IV-VI: Pupils equal round and reactive to light.  Normal conjugate, extra-ocular eye movements in all directions of gaze.  No nystagmus.  No ptosis.   V:  Normal facial sensation.     VII:  Normal facial symmetry and movements.    VIII:  Normal hearing and vestibular function.   IX-X:  Normal palatal movement.   XI:  Normal shoulder shrug and head rotation.   XII:  Normal tongue strength and range of motion, no deviation or fasciculation.  MOTOR:  No atrophy, fasciculations or abnormal movements.  No pronator drift.  Tone is normal.    Right Upper Extremity:    Left Upper Extremity:    Deltoid  5/5   Deltoid  5/5   Biceps  5/5   Biceps  5/5   Triceps  5/5   Triceps  5/5   Wrist extensors  5/5   Wrist extensors  5/5   Wrist  flexors  5/5   Wrist flexors  5/5   Finger extensors  5/5   Finger extensors  5/5   Finger flexors  5/5   Finger flexors  5/5   Dorsal interossei  5/5   Dorsal interossei  5/5   Abductor pollicis  5/5   Abductor pollicis  5/5   Tone (Ashworth scale)  0  Tone (Ashworth scale)  0   Right Lower Extremity:    Left Lower Extremity:    Hip flexors  5/5   Hip flexors  5/5   Hip extensors  5/5   Hip extensors  5/5   Knee flexors  5/5   Knee flexors  5/5   Knee extensors  5/5   Knee extensors  5/5   Dorsiflexors  5/5   Dorsiflexors  5/5   Plantarflexors  5/5   Plantarflexors  5/5   Toe extensors  5/5   Toe extensors  5/5   Toe flexors  5/5   Toe flexors  5/5   Tone (Ashworth scale)  0  Tone (Ashworth scale)  0   MSRs:  Right                                                                 Left brachioradialis 2+  brachioradialis 2+  biceps 2+  biceps 2+  triceps 2+  triceps 2+  patellar 2+  patellar 3+  ankle jerk 2+  ankle jerk 2+  Hoffman no  Hoffman no  plantar response down  plantar response down   SENSORY:  Normal and symmetric perception of light touch, pinprick, vibration, and proprioception.     COORDINATION/GAIT: Normal finger-to- nose-finger.  Intact rapid alternating movements bilaterally. Gait narrow based and stable. Tandem and stressed gait intact.    IMPRESSION: Episodic left hemisensory disturbance affecting the face, arm, and leg.  Symptoms occur  every 2-3 months and last 1-3 days, with or without associated headache.  His exam is notable for left patella hyperreflexia, but this is most likely residual from his cervical spinal stenosis, and otherwise normal.  We discussed that he most likely is having a migraine variant manifesting with sensory changes and mild headache; however, this is a diagnosis of exclusion and imaging of the brain is warranted.    His prior MRI brain from 2010 notes that he has some mass effect at the left distal medulla from tortuous left vertebral  artery, which I have viewed.  There is some localized compression which may effect the posterior column-medial lemniscus pathway and still affect the left side, as the decussation of this pathway is at the medulla.  I would expect that he would have constant symptoms, if this was symptomatic.  It would not affect the spinothalamic pathway as the decussation is at the level of the spinal cord and therefore would expect right sided symptoms.  Therefore, recommend MRI brain wwo contrast and MRA head to reassess.  Further recommendations will be based on these results.     Thank you for allowing me to participate in patient's care.  If I can answer any additional questions, I would be pleased to do so.    Sincerely,    Donika K. Posey Pronto, DO

## 2017-04-04 ENCOUNTER — Ambulatory Visit
Admission: RE | Admit: 2017-04-04 | Discharge: 2017-04-04 | Disposition: A | Discharge status: Home / Self Care | Payer: 59 | Source: Ambulatory Visit | Attending: Neurology | Admitting: Neurology

## 2017-04-04 DIAGNOSIS — R292 Abnormal reflex: Secondary | ICD-10-CM

## 2017-04-04 DIAGNOSIS — R51 Headache: Secondary | ICD-10-CM

## 2017-04-04 DIAGNOSIS — R519 Headache, unspecified: Secondary | ICD-10-CM

## 2017-04-04 DIAGNOSIS — R29818 Other symptoms and signs involving the nervous system: Secondary | ICD-10-CM

## 2017-04-04 DIAGNOSIS — R200 Anesthesia of skin: Secondary | ICD-10-CM | POA: Diagnosis not present

## 2017-04-04 DIAGNOSIS — N289 Disorder of kidney and ureter, unspecified: Secondary | ICD-10-CM

## 2017-04-07 ENCOUNTER — Telehealth: Payer: Self-pay | Admitting: Neurology

## 2017-04-07 NOTE — Telephone Encounter (Signed)
Pt called and wanted MRI results

## 2017-04-07 NOTE — Telephone Encounter (Signed)
Please advise 

## 2017-04-07 NOTE — Telephone Encounter (Signed)
Returned call to patient and informed him that it is possible that his left vertebral artery is causing symptomatic compression of the medulla manifesting in his episodic left hemisensory paresthesias, this was previously seen on his MRI from 2010 and is stable.    Recommend starting aspirin 81mg  daily and using a journal to track his symptoms and blood pressure during these spells.  All questions were answered.    I will see him back in 6-8 months for follow-up.   Chaylee Ehrsam K. Posey Pronto, DO

## 2017-04-30 DIAGNOSIS — L720 Epidermal cyst: Secondary | ICD-10-CM | POA: Diagnosis not present

## 2017-05-28 ENCOUNTER — Ambulatory Visit (INDEPENDENT_AMBULATORY_CARE_PROVIDER_SITE_OTHER): Payer: 59 | Admitting: Podiatry

## 2017-05-28 ENCOUNTER — Encounter: Payer: Self-pay | Admitting: Podiatry

## 2017-05-28 DIAGNOSIS — M722 Plantar fascial fibromatosis: Secondary | ICD-10-CM | POA: Diagnosis not present

## 2017-05-28 MED ORDER — MELOXICAM 15 MG PO TABS: 15.0000 mg | ORAL_TABLET | Freq: Every day | ORAL | 3 refills | Status: DC

## 2017-05-28 NOTE — Patient Instructions (Signed)

## 2017-05-28 NOTE — Progress Notes (Signed)
He presents today with a chief complaint of a painful area medial aspect of his left heel for the past 2 weeks.  He denies changes in his past medical history medications allergy surgeries social history.  Objective: Vital signs are stable he is alert oriented x3 pulses are palpable.  Neurologic sensorium is intact deep tendon reflexes are intact muscle strength is normal symmetrical bilateral.  He has pain on palpation medial calcaneal tubercle of the left heel.  No radiographs were taken today.  Assessment: Plantar fasciitis left heel.  Plan: Discussed etiology pathology conservative versus surgical therapies at this point we injected the area with 20 mg Kenalog 5 mg Marcaine to the point of maximal tenderness of the left heel.  Placement plantar fascial brace discussed appropriate shoe gear stretching exercises ice therapy and started him on meloxicam.  We will follow-up with him in 1 month

## 2017-06-10 DIAGNOSIS — N289 Disorder of kidney and ureter, unspecified: Secondary | ICD-10-CM | POA: Diagnosis not present

## 2017-06-10 DIAGNOSIS — E7800 Pure hypercholesterolemia, unspecified: Secondary | ICD-10-CM | POA: Diagnosis not present

## 2017-06-10 DIAGNOSIS — E039 Hypothyroidism, unspecified: Secondary | ICD-10-CM | POA: Diagnosis not present

## 2017-06-10 DIAGNOSIS — J309 Allergic rhinitis, unspecified: Secondary | ICD-10-CM | POA: Diagnosis not present

## 2017-06-10 DIAGNOSIS — K219 Gastro-esophageal reflux disease without esophagitis: Secondary | ICD-10-CM | POA: Diagnosis not present

## 2017-06-11 ENCOUNTER — Encounter (INDEPENDENT_AMBULATORY_CARE_PROVIDER_SITE_OTHER): Payer: Self-pay | Admitting: Orthopaedic Surgery

## 2017-06-11 ENCOUNTER — Ambulatory Visit (INDEPENDENT_AMBULATORY_CARE_PROVIDER_SITE_OTHER): Payer: Self-pay

## 2017-06-11 ENCOUNTER — Ambulatory Visit (INDEPENDENT_AMBULATORY_CARE_PROVIDER_SITE_OTHER): Payer: 59 | Admitting: Orthopaedic Surgery

## 2017-06-11 DIAGNOSIS — G8929 Other chronic pain: Secondary | ICD-10-CM

## 2017-06-11 DIAGNOSIS — M25561 Pain in right knee: Secondary | ICD-10-CM | POA: Diagnosis not present

## 2017-06-11 DIAGNOSIS — M545 Low back pain: Secondary | ICD-10-CM

## 2017-06-11 MED ORDER — LIDOCAINE HCL 1 % IJ SOLN
3.0000 mL | INTRAMUSCULAR | Status: AC | PRN
  Administered 2017-06-11: 3 mL

## 2017-06-11 MED ORDER — METHYLPREDNISOLONE ACETATE 40 MG/ML IJ SUSP
40.0000 mg | INTRAMUSCULAR | Status: AC | PRN
  Administered 2017-06-11: 40 mg via INTRA_ARTICULAR

## 2017-06-11 MED ORDER — METHYLPREDNISOLONE 4 MG PO TABS: ORAL_TABLET | ORAL | 0 refills | Status: DC

## 2017-06-11 MED ORDER — GABAPENTIN 300 MG PO CAPS: 300.0000 mg | ORAL_CAPSULE | Freq: Every day | ORAL | 0 refills | Status: DC

## 2017-06-11 NOTE — Progress Notes (Signed)
Office Visit Note   Patient: Sean Dickson           Date of Birth: 18-Feb-1965           MRN: [id number] Visit Date: 06/11/2017              Requested by: Gaynelle Arabian, MD [address] [place], [place] [address] PCP: Gaynelle Arabian, MD   Assessment & Plan: Visit Diagnoses:  1. Acute bilateral low back pain, with sciatica presence unspecified   2. Chronic pain of right knee     Plan: Due to the significance of his back pain I am going to keep him out of work today and tomorrow.  I will put him on a steroid taper and Neurontin to take at night.  We will see him back in a week to see how he is doing overall.  I will also have him work on back extension exercises.  I did provide a steroid injection per his request in his right knee.  All questions concerns were answered and addressed.  Obviously if he is worsening to let us know.  However if he is doing much better he can also cancel that appointment.  Follow-Up Instructions: Return in about 1 week (around 06/18/2017).   Orders:  Orders Placed This Encounter  Procedures  . Large Joint Inj  . XR Lumbar Spine 2-3 Views   Meds ordered this encounter  Medications  . methylPREDNISolone (MEDROL) 4 MG tablet    Sig: Medrol dose pack. Take as instructed    Dispense:  21 tablet    Refill:  0  . gabapentin (NEURONTIN) 300 MG capsule    Sig: Take 1 capsule (300 mg total) by mouth at bedtime.    Dispense:  30 capsule    Refill:  0      Procedures: Large Joint Inj: R knee on 06/11/2017 4:40 PM Indications: diagnostic evaluation and pain Details: 22 G 1.5 in needle, superolateral approach  Arthrogram: No  Medications: 3 mL lidocaine 1 %; 40 mg methylPREDNISolone acetate 40 MG/ML Outcome: tolerated well, no immediate complications Procedure, treatment alternatives, risks and benefits explained, specific risks discussed. Consent was given by the patient. Immediately prior to procedure a time out was called to verify the  correct patient, procedure, equipment, support staff and site/side marked as required. Patient was prepped and draped in the usual sterile fashion.       Clinical Data: No additional findings.   Subjective: Chief Complaint  Patient presents with  . Right Knee - Pain  Patient comes in today with chief complaint of flaring up of his right knee in terms of knee pain would like to have a steroid injection.  However for the last few days he has had a significant problem with his back and he feels like he pulled a muscle couple days ago and is hurting on the left side of his back radiating into the sciatic area.  He does have some muscle relaxants at home and is taking that and it helps somewhat but he really had a problem getting around today.  This does radiate down to behind his knee.  It does not go down to his feet.  He has no change in bowel bladder function or weakness in his legs and the numbness and tingling in his feet.  He has had significant cervical spine surgery in the past and is seeing Dr. Vertell Limber for his neck before.  His right knee is  been bothering him and is been so long since he has steroid injection in his knee and that helped he like to have steroid injection in his right knee today.  It hurts mainly on the medial joint line where he points to.  He currently denies any headache, chest pain, shortness of breath, fever, chills, nausea, vomiting.  HPI  Review of Systems See above  Objective: Vital Signs: There were no vitals taken for this visit.  Physical Exam He is alert and oriented x3 in no acute distress Ortho Exam Examination of his right knee does shows a slight effusion.  His medial joint line tenderness with full range of motion of the knee and his knee is ligamentously stable.  Examination of his lumbar spine does show significant pain in his paraspinal muscles to the left side and radiates to the sciatic region.  He has a positive straight leg raise on the left side  but normal strength in muscle tone normal sensation in bilateral lower extremities.   Specialty Comments:  No specialty comments available.  Imaging: Xr Lumbar Spine 2-3 Views  Result Date: 06/11/2017 2 views of the lumbar spine shows anterior osteophytes at several levels.  There is significant disc space narrowing at L4-L5.  There is narrowing of the foramina at the lower aspect of the lumbar spine.  There is slight loss of lumbar lordosis.    PMFS History: Patient Active Problem List   Diagnosis Date Noted  . Hyperlipidemia 09/16/2014  . GERD (gastroesophageal reflux disease) 09/16/2014  . Schatzki's ring 09/16/2014  . Atypical chest pain 09/16/2014  . Mild obesity 09/16/2014   Past Medical History:  Diagnosis Date  . Allergic rhinitis   . Anxiety attack   . Anxiety state, unspecified    with attacks  . Atypical chest pain   . Esophageal spasm   . GERD (gastroesophageal reflux disease)    schatzki ring  . H/O adenomatous polyp of colon   . History of panic attacks   . Hypercholesteremia   . IBS (irritable bowel syndrome)   . Kidney stone   . Schatzki's ring     Family History  Problem Relation Age of Onset  . Other Mother        palpitations  . Macular degeneration Mother   . Non-Hodgkin's lymphoma Father   . Uterine cancer Sister   . Colon polyps Sister 74       4 adenoma    Past Surgical History:  Procedure Laterality Date  . anterior cervical repair    . INGUINAL HERNIA REPAIR Right 2005  . TONSILLECTOMY    . WISDOM TOOTH EXTRACTION     Social History   Occupational History  . Occupation: Quarry manager: [employer]  Tobacco Use  . Smoking status: Never Smoker  . Smokeless tobacco: Never Used  Substance and Sexual Activity  . Alcohol use: No    Alcohol/week: 0.0 oz  . Drug use: No  . Sexual activity: Not on file

## 2017-06-18 ENCOUNTER — Ambulatory Visit (INDEPENDENT_AMBULATORY_CARE_PROVIDER_SITE_OTHER): Payer: 59 | Admitting: Physician Assistant

## 2017-06-29 ENCOUNTER — Encounter (INDEPENDENT_AMBULATORY_CARE_PROVIDER_SITE_OTHER): Payer: Self-pay | Admitting: Physician Assistant

## 2017-06-29 ENCOUNTER — Ambulatory Visit (INDEPENDENT_AMBULATORY_CARE_PROVIDER_SITE_OTHER): Payer: 59 | Admitting: Physician Assistant

## 2017-06-29 ENCOUNTER — Ambulatory Visit (INDEPENDENT_AMBULATORY_CARE_PROVIDER_SITE_OTHER): Payer: 59

## 2017-06-29 DIAGNOSIS — M5136 Other intervertebral disc degeneration, lumbar region: Secondary | ICD-10-CM

## 2017-06-29 DIAGNOSIS — M25561 Pain in right knee: Secondary | ICD-10-CM

## 2017-06-29 MED ORDER — DICLOFENAC SODIUM 75 MG PO TBEC: 75.0000 mg | DELAYED_RELEASE_TABLET | Freq: Two times a day (BID) | ORAL | 1 refills | Status: DC

## 2017-06-29 NOTE — Progress Notes (Signed)
Office Visit Note   Patient: Sean Dickson           Date of Birth: 1965-11-16           MRN: [id number] Visit Date: 06/29/2017              Requested by: Gaynelle Arabian, MD [address] [place], [place] [address] PCP: Gaynelle Arabian, MD   Assessment & Plan: Visit Diagnoses:  1. Acute pain of right knee   2. Right knee pain, unspecified chronicity   3. DDD (degenerative disc disease), lumbar     Plan: Send him to physical therapy both for his back to him for his knee.  Mainly for stretching strengthening, modalities and home exercise program.  We will place him on diclofenac no other NSAIDs while on this.  He will follow-up with [place] on as-needed basis pain persist or becomes worse.  If he develops any mechanical symptoms of the knee or fluid on the knee he will call the office and we will order an MRI to rule out meniscal tear.  Questions encouraged and answered at length today.  Follow-Up Instructions: Return if symptoms worsen or fail to improve.   Orders:  Orders Placed This Encounter  Procedures  . XR Knee 1-2 Views Right  . Ambulatory referral to Physical Therapy   Meds ordered this encounter  Medications  . diclofenac (VOLTAREN) 75 MG EC tablet    Sig: Take 1 tablet (75 mg total) by mouth 2 (two) times daily.    Dispense:  60 tablet    Refill:  1      Procedures: No procedures performed   Clinical Data: No additional findings.   Subjective: Chief Complaint  Patient presents with  . Right Knee - Follow-up, Pain    S/p cortisone injection 06/11/17  . Lower Back - Follow-up    HPI Sean Dickson returns today for follow-up of his low back and right knee.  He states his low back is much better status post Medrol Dosepak and exercises.  In regards to his right knee continues to have pain in the medial aspect of the right knee despite the Dosepak and a cortisone injection in the knee.  States he got minimal relief from the cortisone injection.  He is having no  mechanical symptoms.  States the knee is mostly annoying 2-3 out of 10 pain at worst.  He does wear a knee brace which times helps with does not seem to be helping any longer.  He has had no known injury to the knee.  Previous films over a year ago showed narrowing of the medial compartment moderate.  Review of Systems No fevers chills shortness of breath chest pain nausea or vomiting.  Please see HPI otherwise negative  Objective: Vital Signs: There were no vitals taken for this visit.  Physical Exam  Constitutional: He is oriented to person, place, and time. He appears well-developed and well-nourished. No distress.  Pulmonary/Chest: Effort normal.  Neurological: He is alert and oriented to person, place, and time.  Skin: He is not diaphoretic.  Psychiatric: He has a normal mood and affect.    Ortho Exam Right knee full extension full flexion.  Minimal tenderness over the medial joint line.  No instability valgus varus stressing.  Right calf supple nontender. Specialty Comments:  No specialty comments available.  Imaging: No results found.   PMFS History: Patient Active Problem List   Diagnosis Date Noted  . Hyperlipidemia 09/16/2014  .  GERD (gastroesophageal reflux disease) 09/16/2014  . Schatzki's ring 09/16/2014  . Atypical chest pain 09/16/2014  . Mild obesity 09/16/2014   Past Medical History:  Diagnosis Date  . Allergic rhinitis   . Anxiety attack   . Anxiety state, unspecified    with attacks  . Atypical chest pain   . Esophageal spasm   . GERD (gastroesophageal reflux disease)    schatzki ring  . H/O adenomatous polyp of colon   . History of panic attacks   . Hypercholesteremia   . IBS (irritable bowel syndrome)   . Kidney stone   . Schatzki's ring     Family History  Problem Relation Age of Onset  . Other Mother        palpitations  . Macular degeneration Mother   . Non-Hodgkin's lymphoma Father   . Uterine cancer Sister   . Colon polyps Sister 69         4 adenoma    Past Surgical History:  Procedure Laterality Date  . anterior cervical repair    . INGUINAL HERNIA REPAIR Right 2005  . TONSILLECTOMY    . WISDOM TOOTH EXTRACTION     Social History   Occupational History  . Occupation: Quarry manager: [employer]  Tobacco Use  . Smoking status: Never Smoker  . Smokeless tobacco: Never Used  Substance and Sexual Activity  . Alcohol use: No    Alcohol/week: 0.0 oz  . Drug use: No  . Sexual activity: Not on file

## 2017-06-30 ENCOUNTER — Encounter: Payer: Self-pay | Admitting: Podiatry

## 2017-06-30 ENCOUNTER — Ambulatory Visit (INDEPENDENT_AMBULATORY_CARE_PROVIDER_SITE_OTHER): Payer: 59 | Admitting: Podiatry

## 2017-06-30 DIAGNOSIS — M722 Plantar fascial fibromatosis: Secondary | ICD-10-CM

## 2017-06-30 NOTE — Progress Notes (Signed)
He presents today for follow-up of plantar fasciitis states that he is about 95% improved at this point.  He has stopped his meloxicam and started taking diclofenac because of his knee Dr. requesting that rather than meloxicam.  Objective: Vital signs are stable alert and oriented x3.  Pulses are palpable.  No reproducible pain to the left foot on palpation.  Assessment: Resolving of plantar fasciitis left.  Plan: Follow-up with me as needed or with progression.

## 2017-07-03 ENCOUNTER — Other Ambulatory Visit (INDEPENDENT_AMBULATORY_CARE_PROVIDER_SITE_OTHER): Payer: Self-pay | Admitting: Orthopaedic Surgery

## 2017-07-03 NOTE — Telephone Encounter (Signed)
Please advise 

## 2017-07-24 DIAGNOSIS — M795 Residual foreign body in soft tissue: Secondary | ICD-10-CM | POA: Diagnosis not present

## 2017-08-11 DIAGNOSIS — M25561 Pain in right knee: Secondary | ICD-10-CM | POA: Diagnosis not present

## 2017-08-11 DIAGNOSIS — M545 Low back pain: Secondary | ICD-10-CM | POA: Diagnosis not present

## 2017-08-30 ENCOUNTER — Other Ambulatory Visit (INDEPENDENT_AMBULATORY_CARE_PROVIDER_SITE_OTHER): Payer: Self-pay | Admitting: Physician Assistant

## 2017-09-03 DIAGNOSIS — L723 Sebaceous cyst: Secondary | ICD-10-CM | POA: Diagnosis not present

## 2017-09-07 ENCOUNTER — Other Ambulatory Visit: Payer: Self-pay | Admitting: Neurosurgery

## 2017-09-07 DIAGNOSIS — R209 Unspecified disturbances of skin sensation: Principal | ICD-10-CM

## 2017-09-07 DIAGNOSIS — IMO0001 Reserved for inherently not codable concepts without codable children: Secondary | ICD-10-CM

## 2017-09-16 ENCOUNTER — Ambulatory Visit
Admission: RE | Admit: 2017-09-16 | Discharge: 2017-09-16 | Disposition: A | Discharge status: Home / Self Care | Payer: 59 | Source: Ambulatory Visit | Attending: Neurosurgery | Admitting: Neurosurgery

## 2017-09-16 DIAGNOSIS — R209 Unspecified disturbances of skin sensation: Principal | ICD-10-CM

## 2017-09-16 DIAGNOSIS — IMO0001 Reserved for inherently not codable concepts without codable children: Secondary | ICD-10-CM

## 2017-09-19 ENCOUNTER — Other Ambulatory Visit: Payer: Self-pay | Admitting: Podiatry

## 2017-10-14 DIAGNOSIS — M4802 Spinal stenosis, cervical region: Secondary | ICD-10-CM | POA: Diagnosis not present

## 2017-10-14 DIAGNOSIS — M542 Cervicalgia: Secondary | ICD-10-CM | POA: Diagnosis not present

## 2017-10-14 DIAGNOSIS — R209 Unspecified disturbances of skin sensation: Secondary | ICD-10-CM | POA: Diagnosis not present

## 2017-10-21 DIAGNOSIS — E039 Hypothyroidism, unspecified: Secondary | ICD-10-CM | POA: Diagnosis not present

## 2017-11-09 ENCOUNTER — Encounter: Payer: Self-pay | Admitting: Neurology

## 2017-11-09 ENCOUNTER — Ambulatory Visit (INDEPENDENT_AMBULATORY_CARE_PROVIDER_SITE_OTHER): Payer: 59 | Admitting: Neurology

## 2017-11-09 VITALS — BP 100/70 | HR 88 | Ht 76.0 in | Wt 290.0 lb

## 2017-11-09 DIAGNOSIS — R292 Abnormal reflex: Secondary | ICD-10-CM

## 2017-11-09 DIAGNOSIS — R29818 Other symptoms and signs involving the nervous system: Secondary | ICD-10-CM

## 2017-11-09 DIAGNOSIS — Q283 Other malformations of cerebral vessels: Secondary | ICD-10-CM | POA: Diagnosis not present

## 2017-11-09 DIAGNOSIS — R202 Paresthesia of skin: Secondary | ICD-10-CM | POA: Diagnosis not present

## 2017-11-09 NOTE — Patient Instructions (Addendum)
We will check MRI lumbar spine without contrast and call you with the results  Increase gabapentin to 300mg  three times   Return to clinic in 4 months

## 2017-11-09 NOTE — Progress Notes (Signed)
Follow-up Visit   Date: 11/09/17    Sean Dickson MRN: [id number] DOB: August 19, 1965   Interim History: Sean Dickson is a 52 y.o. right-handed Caucasian male with hyperlipidemia, hypothyroidism, GERD, and s/p ACDF at C5-C7 (2017) returning to the clinic for follow-up of left hemisensory changes.  The patient was accompanied to the clinic by self.  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  History of present illness: Starting around 10 years ago, he began experiencing different sensation of left face, arm, and leg occurring about once every 2 months, lasting 2-3 days.  He does not have numbness, tingling, or burning sensation.  There is no pain.  There is very subtle change that he feels that his hand and leg movements take more effort, but there is no frank weakness. Cold temperatures makes him much more sensitivity to these changes.  He occasionally has headaches, over the left parietal region.  There is no nausea, photophobia, or phonophobia.  Headaches are more annoying, than throbbing or pounding.  He typically does not have to treat the headaches, when necessary, he takes tylenol which helps headaches and sometimes also the abnormal sensation.  There is no associated dysarthria, dysphagia.    He had MRI brain in 2010 for these symptoms which showed no clear findings to explain symptoms, there was some mass effects at the cervicomedullary junction and left medulla due to tortuosity of the distal left vertebral artery.  He saw Dr. Maureen Chatters at [facility] who ordered NCS/EMG of the left side which showed mild left carpal tunnel.  He continues to have these spells every 2-3 months, however in November, the spell was more intense and bothersome and he mentioned it to Dr. Vertell Limber who referred him to see me for evaluation.   UPDATE 11/09/2017:  He was started on aspirin 81mg  and has noticed reduced frequency of left sided numbness/tingling.  Several months ago, he started exercise abs challenge and  following this, began having numbness/tingling of the tips of the toes. He was concerned about his neck causing problems because this is how his compressive symptoms started.  He saw Dr. Vertell Limber who performed MRI cervical spine which showed stable post-op changes without interval nerve impingement.  He was given gabapentin 300mg  twice daily, which has helped symptoms.  He does not have weakness or imbalance. He had chronic low back pain.   Medications:  Current Outpatient Medications on File Prior to Visit  Medication Sig Dispense Refill  . diclofenac (VOLTAREN) 75 MG EC tablet TAKE 1 TABLET BY MOUTH TWICE A DAY 60 tablet 1  . fenofibrate 160 MG tablet     . fluorouracil (EFUDEX) 5 % cream Apply topically 2 (two) times daily. 40 g 1  . fluticasone (FLONASE) 50 MCG/ACT nasal spray Place 1 spray into the nose as needed.    . gabapentin (NEURONTIN) 300 MG capsule TAKE 1 CAPSULE BY MOUTH EVERYDAY AT BEDTIME 90 capsule 1  . levothyroxine (SYNTHROID, LEVOTHROID) 100 MCG tablet TAKE 1 TABLET BY MOUTH EVERY DAY ON EMPTY STOMACH  1  . Multiple Vitamins-Minerals (CENTRUM SILVER PO) Take by mouth.    [name] omeprazole (PRILOSEC) 40 MG capsule TAKE 1 CAPSULE ONCE A DAY FOR ACID REFLUX ORALLY  3  . OVER THE COUNTER MEDICATION Tumeric 538     No current facility-administered medications on file prior to visit.     Allergies:  Allergies  Allergen Reactions  . Prozac [Fluoxetine] Other (See Comments)    Psychologically Numb  .  Nitroglycerin Other (See Comments)    Headaches  . Atorvastatin Other (See Comments)    GERD, Myalgias  . Codeine     Stomach upset   . Pravastatin Other (See Comments)    Myalgias  . Red Yeast Rice [Cholestin] Other (See Comments)    GERD     Review of Systems:  CONSTITUTIONAL: No fevers, chills, night sweats, or weight loss.  EYES: No visual changes or eye pain ENT: No hearing changes.  No history of nose bleeds.   RESPIRATORY: No cough, wheezing and shortness of breath.     CARDIOVASCULAR: Negative for chest pain, and palpitations.   GI: Negative for abdominal discomfort, blood in stools or black stools.  No recent change in bowel habits.   GU:  No history of incontinence.   MUSCLOSKELETAL: No history of joint pain or swelling.  No myalgias.   SKIN: Negative for lesions, rash, and itching.   ENDOCRINE: Negative for cold or heat intolerance, polydipsia or goiter.   PSYCH:  No depression or anxiety symptoms.   NEURO: As Above.   Vital Signs:  BP 100/70   Pulse 88   Ht 6\' 4"  (1.93 m)   Wt 290 lb (131.5 kg)   SpO2 97%   BMI 35.30 kg/m   General Medical Exam:   General:  Well appearing, comfortable  Eyes/ENT: see cranial nerve examination.   Neck: No masses appreciated.  Full range of motion without tenderness.  No carotid bruits. Respiratory:  Clear to auscultation, good air entry bilaterally.   Cardiac:  Regular rate and rhythm, no murmur.   Ext:  No edema  Neurological Exam: MENTAL STATUS including orientation to time, place, person, recent and remote memory, attention span and concentration, language, and fund of knowledge is normal.  Speech is not dysarthric.  CRANIAL NERVES: No visual field defects.  Pupils equal round and reactive to light.  Normal conjugate, extra-ocular eye movements in all directions of gaze.  No ptosis. Normal facial sensation.  Face is symmetric. Palate elevates symmetrically.  Tongue is midline.  MOTOR:  Motor strength is 5/5 in all extremities  No atrophy, fasciculations or abnormal movements.  No pronator drift.  Tone is normal.    MSRs:  Right                                                                 Left brachioradialis 2+  brachioradialis 2+  biceps 2+  biceps 2+  triceps 2+  triceps 2+  patellar 3+  patellar 3+  ankle jerk 2+  ankle jerk 2+  Hoffman no  Hoffman no  plantar response down  plantar response up   SENSORY:  Intact to temperature, pin prick, and vibration throughout, including distally in the  feet.  COORDINATION/GAIT:  Normal finger-to- nose-finger.  Intact rapid alternating movements bilaterally.  Gait narrow based and stable.   Data: MRI cervical spine wo contrast 09/16/2017   1. No noted progression since 12/03/2016. 2. C5-6 and C6-7 ACDF with solid arthrodesis. Residual foraminal narrowing at C5-6. 3. C4-5 moderate bilateral foraminal narrowing. 4. Diffusely patent spinal canal.  MRI/A brain 04/05/2017: 1. Left vertebral artery is ectatic and compresses the left lateral lower medulla. Vertebral artery compression of the medulla can rarely be symptomatic, clinical correlation is  recommended. Arch Neurol. 2006;63(2):234-241 2. Otherwise normal MRI and MRA of the head.  MRI brain wwo contrast 05/16/2008: 1. No acute intracranial abnormality. 2. Mass effect on the cervicomedullary junction and left medulla without edema is related to tortuosity of the distal left vertebral artery.  IMPRESSION/PLAN: 1.  Bilateral toe numbness in the setting of brisk reflexes raises concern of of lumbosacral canal stenosis.  Alternatively, reflexes can be brisk from his known cervical canal stenosis.  Although his history raises possibility of neuropathy, normal sensation and brisk reflexes makes this less likely.  I will start with MRI lumbar spine to evaluate for compressive lesion.  Consider NCS/EMG of the legs, if negative.  For paresthesias, increase gabapentin to 300mg  TID.  2.  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  Continue aspirin 81mg  which has reduced frequency of spells.  Return to clinic in 4 months   Thank you for allowing me to participate in patient's care.  If I can answer any additional questions, I would be pleased to do so.    Sincerely,    Maricela Kawahara K. Posey Pronto, DO

## 2017-11-10 ENCOUNTER — Other Ambulatory Visit: Payer: Self-pay | Admitting: *Deleted

## 2017-11-10 DIAGNOSIS — R202 Paresthesia of skin: Secondary | ICD-10-CM

## 2017-11-10 DIAGNOSIS — R292 Abnormal reflex: Secondary | ICD-10-CM

## 2017-11-10 DIAGNOSIS — R29818 Other symptoms and signs involving the nervous system: Secondary | ICD-10-CM

## 2017-11-18 ENCOUNTER — Ambulatory Visit
Admission: RE | Admit: 2017-11-18 | Discharge: 2017-11-18 | Disposition: A | Discharge status: Home / Self Care | Payer: 59 | Source: Ambulatory Visit | Attending: Neurology | Admitting: Neurology

## 2017-11-18 DIAGNOSIS — R292 Abnormal reflex: Secondary | ICD-10-CM

## 2017-11-18 DIAGNOSIS — M5416 Radiculopathy, lumbar region: Secondary | ICD-10-CM | POA: Diagnosis not present

## 2017-11-18 DIAGNOSIS — R29818 Other symptoms and signs involving the nervous system: Secondary | ICD-10-CM

## 2017-11-18 DIAGNOSIS — R202 Paresthesia of skin: Secondary | ICD-10-CM

## 2017-11-20 ENCOUNTER — Telehealth: Payer: Self-pay | Admitting: *Deleted

## 2017-11-20 NOTE — Telephone Encounter (Signed)
-----   Message from Alda Berthold, DO sent at 11/19/2017  5:22 PM EDT ----- Please inform patient that his MRI of the back does not show nerve imAnd looks good.  If he is still having tingling of the toes, the next step is NCS/EMG of the legs. Thanks.

## 2017-11-20 NOTE — Telephone Encounter (Signed)
Patient given results and would like to have nerve study done.  Hinton Dyer will call to schedule.

## 2017-11-23 ENCOUNTER — Other Ambulatory Visit: Payer: Self-pay | Admitting: *Deleted

## 2017-11-23 DIAGNOSIS — R200 Anesthesia of skin: Secondary | ICD-10-CM

## 2017-11-23 DIAGNOSIS — R202 Paresthesia of skin: Secondary | ICD-10-CM

## 2017-12-14 DIAGNOSIS — E7800 Pure hypercholesterolemia, unspecified: Secondary | ICD-10-CM | POA: Diagnosis not present

## 2017-12-14 DIAGNOSIS — Z0000 Encounter for general adult medical examination without abnormal findings: Secondary | ICD-10-CM | POA: Diagnosis not present

## 2017-12-21 DIAGNOSIS — K5730 Diverticulosis of large intestine without perforation or abscess without bleeding: Secondary | ICD-10-CM | POA: Diagnosis not present

## 2017-12-21 DIAGNOSIS — Z86010 Personal history of colonic polyps: Secondary | ICD-10-CM | POA: Diagnosis not present

## 2017-12-21 DIAGNOSIS — Z8371 Family history of colonic polyps: Secondary | ICD-10-CM | POA: Diagnosis not present

## 2017-12-29 ENCOUNTER — Ambulatory Visit (INDEPENDENT_AMBULATORY_CARE_PROVIDER_SITE_OTHER): Payer: 59 | Admitting: Neurology

## 2017-12-29 DIAGNOSIS — R202 Paresthesia of skin: Secondary | ICD-10-CM | POA: Diagnosis not present

## 2017-12-29 DIAGNOSIS — R200 Anesthesia of skin: Secondary | ICD-10-CM

## 2017-12-29 NOTE — Progress Notes (Signed)
Follow-up Visit   Date: 12/29/17    Sean Dickson MRN: [id number] DOB: 10-05-1965   Interim History: Sean Dickson is a 52 y.o. right-handed Caucasian male with hyperlipidemia, hypothyroidism, GERD, and s/p ACDF at C5-C7 (2017) returning to the clinic for follow-up of left hemisensory changes.  The patient was accompanied to the clinic by self.  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  History of present illness: Starting around 10 years ago, he began experiencing different sensation of left face, arm, and leg occurring about once every 2 months, lasting 2-3 days.  He does not have numbness, tingling, or burning sensation.  There is no pain.  There is very subtle change that he feels that his hand and leg movements take more effort, but there is no frank weakness. Cold temperatures makes him much more sensitivity to these changes.  He occasionally has headaches, over the left parietal region.  There is no nausea, photophobia, or phonophobia.  Headaches are more annoying, than throbbing or pounding.  He typically does not have to treat the headaches, when necessary, he takes tylenol which helps headaches and sometimes also the abnormal sensation.  There is no associated dysarthria, dysphagia.    He had MRI brain in 2010 for these symptoms which showed no clear findings to explain symptoms, there was some mass effects at the cervicomedullary junction and left medulla due to tortuosity of the distal left vertebral artery.  He saw Dr. Maureen Chatters at [facility] who ordered NCS/EMG of the left side which showed mild left carpal tunnel.  He continues to have these spells every 2-3 months, however in November, the spell was more intense and bothersome and he mentioned it to Dr. Vertell Limber who referred him to see me for evaluation.   UPDATE 11/09/2017:  He was started on aspirin 81mg  and has noticed reduced frequency of left sided numbness/tingling.  Several months ago, he started exercise abs challenge and  following this, began having numbness/tingling of the tips of the toes. He was concerned about his neck causing problems because this is how his compressive symptoms started.  He saw Dr. Vertell Limber who performed MRI cervical spine which showed stable post-op changes without interval nerve impingement.  He was given gabapentin 300mg  twice daily, which has helped symptoms.  He does not have weakness or imbalance. He had chronic low back pain.  UPDATE 12/29/2017:  He is here for electrodiagnostic testing of the legs.  MRI lumbar spine was performed and results were discussed.  He does not have nerve encroachment which would explain his leg paresthesias, there is evidence of age-related degenerative changes.  Due to side effects of of lightheadedness, he reduce gabapentin to 300 mg at bedtime and has also skipped it at times.  His tingling of the feet is relatively unchanged, but lightheadedness has markedly improved.He had labs checked by his PCP including TSH, B12, and hemoglobin A1c, all which was normal except mildly elevated A1c at 6.1%.  Medications:  Current Outpatient Medications on File Prior to Visit  Medication Sig Dispense Refill  . diclofenac (VOLTAREN) 75 MG EC tablet TAKE 1 TABLET BY MOUTH TWICE A DAY 60 tablet 1  . fenofibrate 160 MG tablet     . fluorouracil (EFUDEX) 5 % cream Apply topically 2 (two) times daily. 40 g 1  . fluticasone (FLONASE) 50 MCG/ACT nasal spray Place 1 spray into the nose as needed.    . gabapentin (NEURONTIN) 300 MG capsule TAKE 1 CAPSULE BY MOUTH  EVERYDAY AT BEDTIME 90 capsule 1  . levothyroxine (SYNTHROID, LEVOTHROID) 100 MCG tablet TAKE 1 TABLET BY MOUTH EVERY DAY ON EMPTY STOMACH  1  . Multiple Vitamins-Minerals (CENTRUM SILVER PO) Take by mouth.    [name] omeprazole (PRILOSEC) 40 MG capsule TAKE 1 CAPSULE ONCE A DAY FOR ACID REFLUX ORALLY  3  . OVER THE COUNTER MEDICATION Tumeric 538     No current facility-administered medications on file prior to visit.      Allergies:  Allergies  Allergen Reactions  . Prozac [Fluoxetine] Other (See Comments)    Psychologically Numb  . Nitroglycerin Other (See Comments)    Headaches  . Atorvastatin Other (See Comments)    GERD, Myalgias  . Codeine     Stomach upset   . Pravastatin Other (See Comments)    Myalgias  . Red Yeast Rice [Cholestin] Other (See Comments)    GERD     Review of Systems:  CONSTITUTIONAL: No fevers, chills, night sweats, or weight loss.  EYES: No visual changes or eye pain ENT: No hearing changes.  No history of nose bleeds.   RESPIRATORY: No cough, wheezing and shortness of breath.   CARDIOVASCULAR: Negative for chest pain, and palpitations.   GI: Negative for abdominal discomfort, blood in stools or black stools.  No recent change in bowel habits.   GU:  No history of incontinence.   MUSCLOSKELETAL: No history of joint pain or swelling.  No myalgias.   SKIN: Negative for lesions, rash, and itching.   ENDOCRINE: Negative for cold or heat intolerance, polydipsia or goiter.   PSYCH:  No depression or anxiety symptoms.   NEURO: As Above.   Vital Signs:  There were no vitals taken for this visit.  General Medical Exam:   Deferred  Data: MRI cervical spine wo contrast 09/16/2017   1. No noted progression since 12/03/2016. 2. C5-6 and C6-7 ACDF with solid arthrodesis. Residual foraminal narrowing at C5-6. 3. C4-5 moderate bilateral foraminal narrowing. 4. Diffusely patent spinal canal.  MRI/A brain 04/05/2017: 1. Left vertebral artery is ectatic and compresses the left lateral lower medulla. Vertebral artery compression of the medulla can rarely be symptomatic, clinical correlation is recommended. Arch Neurol. 2006;63(2):234-241 2. Otherwise normal MRI and MRA of the head.  MRI lumbar spine 11/19/2017: Chronic appearing degenerative changes throughout the lumbar region. There is no compressive central canal stenosis. There is mild lateral recess narrowing at L3-4  at L4-5, but there is no definite neural compression. Mild foraminal narrowing bilaterally at L3-4 and left more than right at L4-5, again without definite neural compression. It would be doubtful that the patient has tingling sensation in the toes was secondary to these findings.  MRI brain wwo contrast 05/16/2008: 1. No acute intracranial abnormality. 2. Mass effect on the cervicomedullary junction and left medulla without edema is related to tortuosity of the distal left vertebral artery.  IMPRESSION/PLAN: 1.  Bilateral feet paresthesias, intermittent and mild.  There is no evidence of neuropathy electrodiagnostic testing which was performed today.  His sensory responses are low normal, which may suggest that he is at risk to develop neuropathy, especially in the setting of prediabetes.  I have encouraged him to lifestyle modification with diet and exercise was encouraged.  Continue gabapentin 300 mg at bedtime.  2.  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  Continue aspirin 81mg  which has reduced frequency  of spells.  Return to clinic if symptoms get worse   Thank you for allowing me to participate in patient's care.  If I can answer any additional questions, I would be pleased to do so.    Sincerely,    Donika K. Posey Pronto, DO

## 2017-12-29 NOTE — Procedures (Signed)
[facility]  [address]  [place], [place] [address] Tel: [phone] Fax:  [phone] Test Date:  12/29/2017  Patient: Sean Dickson DOB: Jun 12, 1965 Physician: Narda Amber, DO  Sex: Male Height: 6\' 4"  Ref Phys: Narda Amber, DO  ID#: [id number] Temp: 32.6C Technician:    Patient Complaints: This is a 52 year-old man referred for evaluation of bilateral feet tingling.  NCV & EMG Findings: Electrodiagnostic testing of the right lower extremity and additional studies of the left shows: 1. Bilateral sural and superficial peroneal sensory responses are within normal limits. 2. Bilateral peroneal and tibial motor responses are within normal limits. 3. Left tibial H reflex study is within normal limits. 4. There is no evidence of active or chronic motor axonal changes affecting any of the tested muscles.  Motor unit configuration and recruitment pattern is within normal limits.  Impression: This is a normal study of the lower extremities.  In particular, there is no evidence of a sensorimotor polyneuropathy or lumbosacral radiculopathy.   ___________________________ Narda Amber, DO    Nerve Conduction Studies Anti Sensory Summary Table   Stim Site NR Peak (ms) Norm Peak (ms) P-T Amp (V) Norm P-T Amp  Left Sup Peroneal Anti Sensory (Ant Lat Mall)  32.6C  12 cm    2.5 <4.6 6.3 >4  Right Sup Peroneal Anti Sensory (Ant Lat Mall)  32.6C  12 cm    3.7 <4.6 5.1 >4  Left Sural Anti Sensory (Lat Mall)  32.6C  Calf    3.5 <4.6 4.8 >4  Right Sural Anti Sensory (Lat Mall)  32.6C  Calf    3.9 <4.6 6.2 >4   Motor Summary Table   Stim Site NR Onset (ms) Norm Onset (ms) O-P Amp (mV) Norm O-P Amp Site1 Site2 Delta-0 (ms) Dist (cm) Vel (m/s) Norm Vel (m/s)  Left Peroneal Motor (Ext Dig Brev)  32.6C  Ankle    4.4 <6.0 2.5 >2.5 B Fib Ankle 8.6 41.0 48 >40  B Fib    13.0  1.9  Poplt B Fib 1.8 10.0 56 >40  Poplt    14.8  1.6         Right Peroneal Motor (Ext Dig  Brev)  32.6C  Ankle    3.3 <6.0 3.2 >2.5 B Fib Ankle 9.5 42.0 44 >40  B Fib    12.8  3.0  Poplt B Fib 1.2 8.0 67 >40  Poplt    14.0  3.0         Left Peroneal TA Motor (Tib Ant)  32.6C  Fib Head    2.6 <4.5 5.8 >3 Poplit Fib Head 1.2 10.0 83 >40  Poplit    3.8  5.8         Left Tibial Motor (Abd Hall Brev)  32.6C  Ankle    3.8 <6.0 11.2 >4 Knee Ankle 11.0 44.0 40 >40  Knee    14.8  7.4         Right Tibial Motor (Abd Hall Brev)  32.6C  Ankle    5.0 <6.0 12.1 >4 Knee Ankle 9.6 46.0 48 >40  Knee    14.6  8.6          H Reflex Studies   NR H-Lat (ms) Lat Norm (ms) L-R H-Lat (ms)  Left Tibial (Gastroc)  32.6C     33.47 <35    EMG   Side Muscle Ins Act Fibs Psw Fasc Number Recrt Dur Dur. Amp Amp. Poly Poly.  Comment  Left AntTibialis Nml Nml Nml Nml Nml Nml Nml Nml Nml Nml Nml Nml N/A  Left Gastroc Nml Nml Nml Nml Nml Nml Nml Nml Nml Nml Nml Nml N/A  Left Flex Dig Long Nml Nml Nml Nml Nml Nml Nml Nml Nml Nml Nml Nml N/A  Left RectFemoris Nml Nml Nml Nml Nml Nml Nml Nml Nml Nml Nml Nml N/A  Left BicepsFemS Nml Nml Nml Nml Nml Nml Nml Nml Nml Nml Nml Nml N/A      Waveforms:

## 2018-01-11 ENCOUNTER — Telehealth: Payer: Self-pay | Admitting: Podiatry

## 2018-01-11 NOTE — Telephone Encounter (Signed)
Pt called wanting to discuss issues he is having with his feet. Pt was seen in June for Plantar Fasciitis and wants to see what the Dr. suggests. Please give pt a call.

## 2018-01-11 NOTE — Telephone Encounter (Signed)
Left message informing pt that some time plantar fasciitis can resolve and then it may turn to episodic, to go back in to good supportive shoes with the inserts/orthotics and rest and ice, and make an appt.

## 2018-01-13 DIAGNOSIS — M4712 Other spondylosis with myelopathy, cervical region: Secondary | ICD-10-CM | POA: Diagnosis not present

## 2018-01-13 DIAGNOSIS — Z6827 Body mass index (BMI) 27.0-27.9, adult: Secondary | ICD-10-CM | POA: Diagnosis not present

## 2018-01-13 DIAGNOSIS — R209 Unspecified disturbances of skin sensation: Secondary | ICD-10-CM | POA: Diagnosis not present

## 2018-01-18 ENCOUNTER — Ambulatory Visit (INDEPENDENT_AMBULATORY_CARE_PROVIDER_SITE_OTHER): Payer: 59 | Admitting: Podiatry

## 2018-01-18 DIAGNOSIS — M722 Plantar fascial fibromatosis: Secondary | ICD-10-CM | POA: Diagnosis not present

## 2018-01-18 DIAGNOSIS — G629 Polyneuropathy, unspecified: Secondary | ICD-10-CM | POA: Diagnosis not present

## 2018-01-18 MED ORDER — METHYLPREDNISOLONE 4 MG PO TABS: 4.0000 mg | ORAL_TABLET | Freq: Every day | ORAL | 0 refills | Status: DC

## 2018-01-18 NOTE — Progress Notes (Signed)
He presents today concerned about neuropathy and tingling in his toes and soreness for the past month or so.  States that he is been on gabapentin for a while could not take that because of the dizziness and has just recently switched to Lyrica only taking his first dose yesterday.  He states that he had pain in his arms and shoulders as well as in his legs and toes that radiates from his toes to his thigh.  He states that prior to his neck fusion this all was taking place and after the neck fusion he was doing very well.  He states that until just recently he started to develop the pain in the legs and the toes again but not in the arms or in the hands.  He saw his neurosurgeon who stated that his neck fusion was good.  That neurosurgeon then sent him to neurology who did multiple sensory tests as well as an MRI demonstrating no significant findings.  Nerve conduction velocity exam was negative.  Patient does state that he saw an orthopedist in the past with similar pain in his feet and legs who had diagnosed him with back issues and put him on a steroid that alleviated not only the back pain but the numbness and tingling in his toes and legs.  He recently been seen by his primary care provider and blood work was performed.  Blood work consisted of a CBC a CMP TSH and a PSA.  He did have a hemoglobin A1c drawn which was slightly elevated at 6.1.  Objective: Vital signs are stable he is alert and oriented x3 pulses are palpable bilateral.  Neurologic sensorium is intact per Semmes Weinstein monofilament bilateral tips of toes top of foot and leg.  Muscle strength is normal and symmetrical bilateral.  Orthopedic evaluation demonstrates all joints distal to the ankle have full range of motion without crepitation.  No acute findings.  Cutaneous evaluation demonstrates supple well-hydrated cutis no erythema edema cellulitis drainage or odor.  Radiographs were not taken today.  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  Can possibly be associated with vitamin D deficiency or B12 deficiency but also be back or spine related.  Plan: Discussed etiology pathology conservative or surgical therapies.  Since he has only been taking the gabapentin for short time and he is already stopped that he took the Lyrica 1 dose I recommended that he stop that start a Medrol Dosepak for the next 6 days and we are also requesting a B12 and folate as well as a vitamin D.  Should these come back abnormal I will notify him immediately otherwise I will follow-up with him in about a month to see how he is doing.

## 2018-01-19 LAB — B12 AND FOLATE PANEL
FOLATE: 12.5 ng/mL (ref >3.0)
Vitamin B-12: 589 pg/mL (ref 232–1245)

## 2018-01-19 LAB — VITAMIN D 25 HYDROXY (VIT D DEFICIENCY, FRACTURES): Vit D, 25-Hydroxy: 21.2 ng/mL — ABNORMAL LOW (ref 30.0–100.0)

## 2018-01-26 ENCOUNTER — Telehealth: Payer: Self-pay | Admitting: *Deleted

## 2018-01-26 NOTE — Telephone Encounter (Signed)
-----   Message from Garrel Ridgel, [place] sent at 01/26/2018  7:34 AM EST ----- B12 looks good, its mid range but vitamin D is considerably low.  He should probably follow up with primary for prescription dosing initially and will then most likely go to over the counter med for maintenance.

## 2018-01-26 NOTE — Telephone Encounter (Signed)
I called Sean Dickson and informed of Dr. Stephenie Acres review of results and that I would fax copy of the results to Dr. Marisue Humble. Sean Dickson states understanding.

## 2018-01-26 NOTE — Telephone Encounter (Signed)
Faxed labs to Dr. Marisue Humble.

## 2018-01-28 DIAGNOSIS — E559 Vitamin D deficiency, unspecified: Secondary | ICD-10-CM | POA: Diagnosis not present

## 2018-02-05 ENCOUNTER — Other Ambulatory Visit: Payer: Self-pay | Admitting: Podiatry

## 2018-03-22 DIAGNOSIS — J019 Acute sinusitis, unspecified: Secondary | ICD-10-CM | POA: Diagnosis not present

## 2018-03-29 DIAGNOSIS — Z6835 Body mass index (BMI) 35.0-35.9, adult: Secondary | ICD-10-CM | POA: Diagnosis not present

## 2018-03-29 DIAGNOSIS — R209 Unspecified disturbances of skin sensation: Secondary | ICD-10-CM | POA: Diagnosis not present

## 2018-03-29 DIAGNOSIS — M4712 Other spondylosis with myelopathy, cervical region: Secondary | ICD-10-CM | POA: Diagnosis not present

## 2018-06-14 DIAGNOSIS — E7800 Pure hypercholesterolemia, unspecified: Secondary | ICD-10-CM | POA: Diagnosis not present

## 2018-06-14 DIAGNOSIS — R7303 Prediabetes: Secondary | ICD-10-CM | POA: Diagnosis not present

## 2018-06-14 DIAGNOSIS — K219 Gastro-esophageal reflux disease without esophagitis: Secondary | ICD-10-CM | POA: Diagnosis not present

## 2018-06-17 DIAGNOSIS — E7800 Pure hypercholesterolemia, unspecified: Secondary | ICD-10-CM | POA: Diagnosis not present

## 2018-06-17 DIAGNOSIS — N289 Disorder of kidney and ureter, unspecified: Secondary | ICD-10-CM | POA: Diagnosis not present

## 2018-06-17 DIAGNOSIS — E039 Hypothyroidism, unspecified: Secondary | ICD-10-CM | POA: Diagnosis not present

## 2018-06-17 DIAGNOSIS — R7303 Prediabetes: Secondary | ICD-10-CM | POA: Diagnosis not present

## 2018-08-23 ENCOUNTER — Other Ambulatory Visit: Payer: Self-pay | Admitting: Family Medicine

## 2018-08-23 ENCOUNTER — Ambulatory Visit
Admission: RE | Admit: 2018-08-23 | Discharge: 2018-08-23 | Disposition: A | Discharge status: Home / Self Care | Payer: 59 | Source: Ambulatory Visit | Attending: Family Medicine | Admitting: Family Medicine

## 2018-08-23 DIAGNOSIS — R0609 Other forms of dyspnea: Secondary | ICD-10-CM

## 2018-08-25 ENCOUNTER — Other Ambulatory Visit (HOSPITAL_COMMUNITY): Payer: Self-pay | Admitting: Respiratory Therapy

## 2018-08-25 DIAGNOSIS — R0609 Other forms of dyspnea: Secondary | ICD-10-CM

## 2018-08-31 ENCOUNTER — Ambulatory Visit (HOSPITAL_COMMUNITY)
Admission: RE | Admit: 2018-08-31 | Discharge: 2018-08-31 | Disposition: A | Discharge status: Home / Self Care | Payer: 59 | Source: Ambulatory Visit | Attending: Family Medicine | Admitting: Family Medicine

## 2018-08-31 ENCOUNTER — Other Ambulatory Visit: Payer: Self-pay

## 2018-08-31 DIAGNOSIS — R0609 Other forms of dyspnea: Secondary | ICD-10-CM | POA: Diagnosis not present

## 2018-08-31 LAB — PULMONARY FUNCTION TEST
DL/VA % pred: 113 %
DL/VA: 4.87 ml/min/mmHg/L
DLCO unc % pred: 83 %
DLCO unc: 28 ml/min/mmHg
FEF 25-75 Post: 4.79 L/sec
FEF 25-75 Pre: 4.13 L/sec
FEF2575-%Change-Post: 15 %
FEF2575-%Pred-Post: 124 %
FEF2575-%Pred-Pre: 107 %
FEV1-%Change-Post: 2 %
FEV1-%Pred-Post: 82 %
FEV1-%Pred-Pre: 80 %
FEV1-Post: 3.74 L
FEV1-Pre: 3.64 L
FEV1FVC-%Change-Post: 6 %
FEV1FVC-%Pred-Pre: 107 %
FEV6-%Change-Post: -3 %
FEV6-%Pred-Post: 74 %
FEV6-%Pred-Pre: 77 %
FEV6-Post: 4.22 L
FEV6-Pre: 4.37 L
FEV6FVC-%Change-Post: 0 %
FEV6FVC-%Pred-Post: 103 %
FEV6FVC-%Pred-Pre: 103 %
FVC-%Change-Post: -3 %
FVC-%Pred-Post: 71 %
FVC-%Pred-Pre: 74 %
FVC-Post: 4.22 L
FVC-Pre: 4.39 L
Post FEV1/FVC ratio: 89 %
Post FEV6/FVC ratio: 100 %
Pre FEV1/FVC ratio: 83 %
Pre FEV6/FVC Ratio: 100 %

## 2018-08-31 MED ORDER — ALBUTEROL SULFATE (2.5 MG/3ML) 0.083% IN NEBU
2.5000 mg | INHALATION_SOLUTION | Freq: Once | RESPIRATORY_TRACT | Status: AC
Start: 2018-08-31 — End: 2018-08-31
  Administered 2018-08-31: 2.5 mg via RESPIRATORY_TRACT

## 2018-11-08 ENCOUNTER — Ambulatory Visit: Payer: Self-pay | Admitting: Surgery

## 2018-11-08 NOTE — H&P (Signed)
CC: Umbilical bulge and soreness  HPI: Sean Dickson is a very pleasant 25yoM with hx of HTN, HLD, hypothyroidism, GERD whom presents to our office for evaluation of an umbilical bulge which she first noticed about 6 months ago. Over the last 6 months, it has become more prominent and he has had discomfort related to it. He describes multiple episodes per week of soreness and pain radiating from his umbilicus across his lower abdomen. This pain is improving lies down and gently reduces the small bulge he has. He denies any history of this being stuck or incarcerated. He denies any fevers/chills/nausea/vomiting or overlying skin changes. He'll occasionally take over-the-counter analgesics to help with the discomfort.  PMH: HTN, HLD (well controlled with diet and fenofibrate), GERD (well controlled with PPI); hypothyroidism (well controlled with Synthroid)  PSH: Open right inguinal hernia, years ago by Dr. Hulen Skains.  FHx: Denies FHx of malignancy  Social: Denies use of tobacco/EtOH/drugs. He works for [employer] - works up on the [employer]  ROS: A comprehensive 10 system review of systems was completed with the patient and pertinent findings as noted above.  The patient is a 53 year old male.   Past Surgical History Geni Bers [place], RMA; 10/25/2018 9:47 AM) Colon Polyp Removal - Colonoscopy  Laparoscopic Inguinal Hernia Surgery  Right. Oral Surgery  Spinal Surgery - Neck  Tonsillectomy   Diagnostic Studies History Geni Bers [place], RMA; 10/25/2018 9:47 AM) Colonoscopy  within last year  Allergies Geni Bers [place], RMA; 10/25/2018 9:48 AM) Codeine and Related  Nausea, Vomiting. Allergies Reconciled   Medication History [employer], RMA; 10/25/2018 9:48 AM) Aspirin (81MG  Tablet DR, Oral) Active. Fenofibrate (160MG  Tablet, Oral) Active. Fluticasone Propionate (50MCG/ACT Suspension, Nasal) Active. Levothyroxine Sodium (100MCG Tablet, Oral) Active. Multi  Vitamin (Oral) Active. Omeprazole (40MG  Capsule DR, Oral) Active. Turmeric (500MG  Capsule, Oral) Active. Medications Reconciled  Social History Geni Bers [place], RMA; 10/25/2018 9:47 AM) Caffeine use  Carbonated beverages, Tea. No alcohol use  No drug use  Tobacco use  Never smoker.  Family History Geni Bers [place], RMA; 10/25/2018 9:47 AM) Alcohol Abuse  Father. Cancer  Father. Colon Polyps  Sister. Depression  Sister. Ovarian Cancer  Sister. Respiratory Condition  Father. Thyroid problems  Mother.  Other Problems Geni Bers [place], RMA; 10/25/2018 9:47 AM) Anxiety Disorder  Gastroesophageal Reflux Disease  Hemorrhoids  Hypercholesterolemia  Inguinal Hernia  Kidney Stone  Migraine Headache  Thyroid Disease     Review of Systems Geni Bers [place] RMA; 10/25/2018 9:47 AM) General Present- Fatigue. Not Present- Appetite Loss, Chills, Fever, Night Sweats, Weight Gain and Weight Loss. HEENT Present- Hearing Loss and Seasonal Allergies. Not Present- Earache, Hoarseness, Nose Bleed, Oral Ulcers, Ringing in the Ears, Sinus Pain, Sore Throat, Visual Disturbances, Wears glasses/contact lenses and Yellow Eyes. Respiratory Present- Snoring. Not Present- Bloody sputum, Chronic Cough, Difficulty Breathing and Wheezing. Breast Not Present- Breast Mass, Breast Pain, Nipple Discharge and Skin Changes. Cardiovascular Present- Shortness of Breath. Not Present- Chest Pain, Difficulty Breathing Lying Down, Leg Cramps, Palpitations, Rapid Heart Rate and Swelling of Extremities. Gastrointestinal Present- Bloating, Change in Bowel Habits and Indigestion. Not Present- Abdominal Pain, Bloody Stool, Chronic diarrhea, Constipation, Difficulty Swallowing, Excessive gas, Gets full quickly at meals, Hemorrhoids, Nausea, Rectal Pain and Vomiting. Male Genitourinary Present- Nocturia and Urgency. Not Present- Blood in Urine, Change in Urinary Stream, Frequency, Impotence, Painful  Urination and Urine Leakage. Musculoskeletal Not Present- Back Pain, Joint Pain, Joint Stiffness, Muscle Pain, Muscle Weakness and Swelling of Extremities. Neurological Not Present- Decreased Memory, Fainting, Headaches, Numbness,  Seizures, Tingling, Tremor, Trouble walking and Weakness. Psychiatric Not Present- Anxiety, Bipolar, Change in Sleep Pattern, Depression, Fearful and Frequent crying. Endocrine Not Present- Cold Intolerance, Excessive Hunger, Hair Changes, Heat Intolerance, Hot flashes and New Diabetes. Hematology Not Present- Blood Thinners, Easy Bruising, Excessive bleeding, Gland problems, HIV and Persistent Infections.  Vitals [employer] [place] RMA; 10/25/2018 9:49 AM) 10/25/2018 9:48 AM Weight: 300.2 lb Height: 76in Body Surface Area: 2.63 m Body Mass Index: 36.54 kg/m  Temp.: 60F (Temporal)  Pulse: 96 (Regular)  P.OX: 97% (Room air) BP: 160/100(Sitting, Left Arm, Standard)       Physical Exam Harrell Gave M. Yasamin Karel MD; 10/25/2018 10:01 AM) The physical exam findings are as follows: Note: Constitutional: No acute distress; conversant; no deformities Eyes: Moist conjunctiva; no lid lag; anicteric sclerae; pupils equal round and reactive to light Neck: Trachea midline; no palpable thyromegaly Lungs: Normal respiratory effort; no tactile fremitus CV: Regular rate and rhythm; no palpable thrill; no pitting edema GI: Abdomen soft, nontender, nondistended; no palpable hepatosplenomegaly. Small reducible umbilical bulge without skin changes. Fascial defect feels small than finger tip. MSK: Normal gait; no clubbing/cyanosis Psychiatric: Appropriate affect; alert and oriented 3 Lymphatic: No palpable cervical or axillary lymphadenopathy    Assessment & Plan Harrell Gave M. Kalasia Crafton MD; [id number] AM)  UMBILICAL HERNIA ([id number]) Story: Mr. Furnish is a very pleasant 34yoM with hx of HTN, HLD, hypothyroidism, GERD here for evaluation of symptomatic umbilical  hernia Impression: -The anatomy and physiology of the GI tract and abdominal wall was discussed at length with the patient with associated illustrations using our Hernia Handout. The pathophysiology of hernias was discussed at length with associated pictures as well. -We discussed open umbilical hernia repair with possible mesh - based on size of defect intraoperatively but anticipate likely mesh placement -The planned procedure, material risks (including, but not limited to, pain, bleeding, infection, scarring, need for blood transfusion, damage to surrounding structures-blood vessels/nerves/viscus/organs, need for additional procedures, recurrence, chronic pain, mesh complications including erosion into other structures/vessels/organs/viscus, pneumonia, heart attack, stroke, death) benefits and alternatives to surgery were discussed at length. I noted a good probability that the procedure help improve his symptoms and the bulge he has noticed; additionally reduce risk of long-term complications related to having a hernia like incarceration/strangulation of contents. The patient's questions were answered to his satisfaction, he voiced understanding and they elected to proceed with surgery. Additionally, we discussed typical postoperative expectations and the recovery process.  Current Plans Pt Education - [facility] Mesh education: discussed with patient and provided information. Signed electronically by Ileana Roup, MD (10/25/2018 10:16 AM)

## 2018-11-30 ENCOUNTER — Other Ambulatory Visit (HOSPITAL_COMMUNITY): Payer: 59

## 2018-12-03 ENCOUNTER — Encounter (HOSPITAL_COMMUNITY): Payer: Self-pay

## 2018-12-03 NOTE — Patient Instructions (Signed)
DUE TO COVID-19 ONLY ONE VISITOR IS ALLOWED TO COME WITH YOU AND STAY IN THE WAITING ROOM ONLY DURING PRE OP AND PROCEDURE DAY OF SURGERY. THE 1 VISITOR MAY VISIT WITH YOU AFTER SURGERY IN YOUR PRIVATE ROOM DURING VISITING HOURS ONLY!  YOU NEED TO HAVE A COVID 19 TEST ON_11/9______ @_______ , THIS TEST MUST BE DONE BEFORE SURGERY, COME  [address], [place] , [address].  (Alex) ONCE YOUR COVID TEST IS COMPLETED, PLEASE BEGIN THE QUARANTINE INSTRUCTIONS AS OUTLINED IN YOUR HANDOUT.                Ellsworth Lennox   Your procedure is scheduled on: 12/09/18   Report to [facility] Main  Entrance   Report to admitting at  12:00 PM     Call this number if you have problems the morning of surgery [phone]    Remember: Do not eat food or drink liquids :After Midnight.   BRUSH YOUR TEETH MORNING OF SURGERY AND RINSE YOUR MOUTH OUT, NO CHEWING GUM CANDY OR MINTS.     Take these medicines the morning of surgery with A SIP OF WATER: Levothyroxine,prilosec, eye drops                                 You may not have any metal on your body including piercings              Do not wear jewelry, lotions, powders or  deodorant                      Men may shave face and neck.   Do not bring valuables to the hospital. [place].  Contacts, dentures or bridgework may not be worn into surgery.      Patients discharged the day of surgery will not be allowed to drive home.   IF YOU ARE HAVING SURGERY AND GOING HOME THE SAME DAY, YOU MUST HAVE AN ADULT TO DRIVE YOU HOME AND BE WITH YOU FOR 24 HOURS.   YOU MAY GO HOME BY TAXI OR UBER OR ORTHERWISE, BUT AN ADULT MUST ACCOMPANY YOU HOME AND STAY WITH YOU FOR 24 HOURS.  Name and phone number of your driver:  Special Instructions: N/A              Please read over the following fact sheets you were  given: _____________________________________________________________________             [facility] - Preparing for Surgery  Before surgery, you can play an important role.   Because skin is not sterile, your skin needs to be as free of germs as possible.   You can reduce the number of germs on your skin by washing with CHG (chlorahexidine gluconate) soap before surgery.   CHG is an antiseptic cleaner which kills germs and bonds with the skin to continue killing germs even after washing. Please DO NOT use if you have an allergy to CHG or antibacterial soaps .  If your skin becomes reddened/irritated stop using the CHG and inform your nurse when you arrive at Short Stay.   You may shave your face/neck.  Please follow these instructions carefully:  1.  Shower with CHG Soap the night before surgery and the  morning of Surgery.  2.  If you choose to wash your hair, wash your hair first as usual with your  normal  shampoo.  3.  After you shampoo, rinse your hair and body thoroughly to remove the  shampoo.                                        4.  Use CHG as you would any other liquid soap.  You can apply chg directly  to the skin and wash                       Gently with a scrungie or clean washcloth.  5.  Apply the CHG Soap to your body ONLY FROM THE NECK DOWN.   Do not use on face/ open                           Wound or open sores. Avoid contact with eyes, ears mouth and genitals (private parts).                       Wash face,  Genitals (private parts) with your normal soap.             6.  Wash thoroughly, paying special attention to the area where your surgery  will be performed.  7.  Thoroughly rinse your body with warm water from the neck down.  8.  DO NOT shower/wash with your normal soap after using and rinsing off  the CHG Soap.             9.  Pat yourself dry with a clean towel.            10.  Wear clean pajamas.            11.  Place clean sheets on your bed the night of  your first shower and do not  sleep with pets. Day of Surgery : Do not apply any lotions/deodorants the morning of surgery.  Please wear clean clothes to the hospital/surgery center.  FAILURE TO FOLLOW THESE INSTRUCTIONS MAY RESULT IN THE CANCELLATION OF YOUR SURGERY PATIENT SIGNATURE_________________________________  NURSE SIGNATURE__________________________________  ________________________________________________________________________

## 2018-12-06 ENCOUNTER — Other Ambulatory Visit: Payer: Self-pay

## 2018-12-06 ENCOUNTER — Encounter (HOSPITAL_COMMUNITY)
Admission: RE | Admit: 2018-12-06 | Discharge: 2018-12-06 | Disposition: A | Discharge status: Home / Self Care | Payer: 59 | Source: Ambulatory Visit | Attending: Surgery | Admitting: Surgery

## 2018-12-06 ENCOUNTER — Other Ambulatory Visit (HOSPITAL_COMMUNITY)
Admission: RE | Admit: 2018-12-06 | Discharge: 2018-12-06 | Disposition: A | Discharge status: Home / Self Care | Payer: 59 | Source: Ambulatory Visit | Attending: Surgery | Admitting: Surgery

## 2018-12-06 ENCOUNTER — Encounter (HOSPITAL_COMMUNITY): Payer: Self-pay

## 2018-12-06 DIAGNOSIS — Z01812 Encounter for preprocedural laboratory examination: Secondary | ICD-10-CM | POA: Diagnosis present

## 2018-12-06 DIAGNOSIS — Z20828 Contact with and (suspected) exposure to other viral communicable diseases: Secondary | ICD-10-CM | POA: Insufficient documentation

## 2018-12-06 HISTORY — DX: Nausea with vomiting, unspecified: R11.2

## 2018-12-06 HISTORY — DX: Hypothyroidism, unspecified: E03.9

## 2018-12-06 HISTORY — DX: Nausea with vomiting, unspecified: Z98.890

## 2018-12-06 HISTORY — DX: Personal history of urinary calculi: Z87.442

## 2018-12-06 LAB — COMPREHENSIVE METABOLIC PANEL
ALT: 20 U/L (ref 0–44)
AST: 22 U/L (ref 15–41)
Albumin: 4.3 g/dL (ref 3.5–5.0)
Alkaline Phosphatase: 37 U/L — ABNORMAL LOW (ref 38–126)
Anion gap: 8 (ref 5–15)
BUN: 25 mg/dL — ABNORMAL HIGH (ref 6–20)
CO2: 29 mmol/L (ref 22–32)
Calcium: 9.7 mg/dL (ref 8.9–10.3)
Chloride: 104 mmol/L (ref 98–111)
Creatinine, Ser: 1.28 mg/dL — ABNORMAL HIGH (ref 0.61–1.24)
GFR calc Af Amer: >60 mL/min (ref >60)
GFR calc non Af Amer: >60 mL/min (ref >60)
Glucose, Bld: 118 mg/dL — ABNORMAL HIGH (ref 70–99)
Potassium: 4.6 mmol/L (ref 3.5–5.1)
Sodium: 141 mmol/L (ref 135–145)
Total Bilirubin: 0.4 mg/dL (ref 0.3–1.2)
Total Protein: 7.4 g/dL (ref 6.5–8.1)

## 2018-12-06 LAB — CBC WITH DIFFERENTIAL/PLATELET
Abs Immature Granulocytes: 0 10*3/uL (ref 0.00–0.07)
Basophils Absolute: 0 10*3/uL (ref 0.0–0.1)
Basophils Relative: 1 %
Eosinophils Absolute: 0.1 10*3/uL (ref 0.0–0.5)
Eosinophils Relative: 2 %
HCT: 43.4 % (ref 39.0–52.0)
Hemoglobin: 13.7 g/dL (ref 13.0–17.0)
Immature Granulocytes: 0 %
Lymphocytes Relative: 25 %
Lymphs Abs: 1 10*3/uL (ref 0.7–4.0)
MCH: 30.2 pg (ref 26.0–34.0)
MCHC: 31.6 g/dL (ref 30.0–36.0)
MCV: 95.6 fL (ref 80.0–100.0)
Monocytes Absolute: 0.3 10*3/uL (ref 0.1–1.0)
Monocytes Relative: 8 %
Neutro Abs: 2.5 10*3/uL (ref 1.7–7.7)
Neutrophils Relative %: 64 %
Platelets: 276 10*3/uL (ref 150–400)
RBC: 4.54 MIL/uL (ref 4.22–5.81)
RDW: 12.3 % (ref 11.5–15.5)
WBC: 4 10*3/uL (ref 4.0–10.5)
nRBC: 0 % (ref 0.0–0.2)

## 2018-12-06 LAB — PROTIME-INR
INR: 0.9 (ref 0.8–1.2)
Prothrombin Time: 12.2 seconds (ref 11.4–15.2)

## 2018-12-06 LAB — APTT: aPTT: 29 seconds (ref 24–36)

## 2018-12-06 NOTE — Progress Notes (Addendum)
PCP - Dr. Oneida Arenas Cardiologist - Dr. Ellyn Hack Neurologist-Dr. Posey Pronto Pulmonary -08/31/18 Sean Dickson  Chest x-Sean Dickson - 08/23/18 EKG - 2016 Stress Test - 2016 ECHO - no Cardiac Cath - no  Sleep Study - no CPAP -   Fasting Blood Sugar - NA Checks Blood Sugar _____ times a day  Blood Thinner Instructions:ASA prescribed by Dr. Posey Pronto for transient tingling on left side of body. Symptoms have reslved since taking ASA Aspirin Instructions:Stop 5 days prior to surgery Last Dose:12/03/18  Anesthesia review:   Patient denies shortness of breath, fever, cough and chest pain at PAT appointment yes  Patient verbalized understanding of instructions that were given to them at the PAT appointment. Patient was also instructed that they will need to review over the PAT instructions again at home before surgery. yes

## 2018-12-07 LAB — NOVEL CORONAVIRUS, NAA (HOSP ORDER, SEND-OUT TO REF LAB; TAT 18-24 HRS): SARS-CoV-2, NAA: NOT DETECTED

## 2018-12-08 MED ORDER — DEXTROSE 5 % IV SOLN
3.0000 g | INTRAVENOUS | Status: AC
  Filled 2018-12-08: qty 3000

## 2018-12-31 NOTE — Patient Instructions (Addendum)
DUE TO COVID-19 ONLY ONE VISITOR IS ALLOWED TO COME WITH YOU AND STAY IN THE WAITING ROOM ONLY DURING PRE OP AND PROCEDURE DAY OF SURGERY. THE 1 VISITOR MAY VISIT WITH YOU AFTER SURGERY IN YOUR PRIVATE ROOM DURING VISITING HOURS ONLY!   A COVID 19 TEST ON__12/7/20_____ @_______ , ONCE YOUR COVID TEST IS COMPLETED, PLEASE BEGIN THE QUARANTINE INSTRUCTIONS AS OUTLINED IN YOUR HANDOUT.                Ellsworth Lennox    Your procedure is scheduled on: 12/10   Report to [facility] Main  Entrance   Report to short stay  5:30 AM     Call this number if you have problems the morning of surgery [phone]    Remember: Do not eat food or drink liquids :After Midnight.   BRUSH YOUR TEETH MORNING OF SURGERY AND RINSE YOUR MOUTH OUT, NO CHEWING GUM CANDY OR MINTS.     Take these medicines the morning of surgery with A SIP OF WATER: Levothyroxine, Prilosec                                 You may not have any metal on your body including piercings              Do not wear jewelry, lotions, powders or deodorant                    Men may shave face and neck.   Do not bring valuables to the hospital. [place].  Contacts, dentures or bridgework may not be worn into surgery.      Patients discharged the day of surgery will not be allowed to drive home  . IF YOU ARE HAVING SURGERY AND GOING HOME THE SAME DAY, YOU MUST HAVE AN ADULT TO DRIVE YOU HOME AND BE WITH YOU FOR 24 HOURS.   YOU MAY GO HOME BY TAXI OR UBER OR ORTHERWISE, BUT AN ADULT MUST ACCOMPANY YOU HOME AND STAY WITH YOU FOR 24 HOURS.  Name and phone number of your driver:  Special Instructions: N/A              Please read over the following fact sheets you were given: _____________________________________________________________________             [facility] - Preparing for Surgery  Before surgery, you can play an important role.   Because skin is not sterile,  your skin needs to be as free of germs as possible.   You can reduce the number of germs on your skin by washing with CHG (chlorahexidine gluconate) soap before surgery.   CHG is an antiseptic cleaner which kills germs and bonds with the skin to continue killing germs even after washing. Please DO NOT use if you have an allergy to CHG or antibacterial soaps.   If your skin becomes reddened/irritated stop using the CHG and inform your nurse when you arrive at Short Stay. You may shave your face/neck.  Please follow these instructions carefully:  1.  Shower with CHG Soap the night before surgery and the  morning of Surgery.  2.  If you choose to wash your hair, wash your hair first as usual with your  normal  shampoo.  3.  After you shampoo, rinse  your hair and body thoroughly to remove the  shampoo.                                        4.  Use CHG as you would any other liquid soap.  You can apply chg directly  to the skin and wash                       Gently with a scrungie or clean washcloth.  5.  Apply the CHG Soap to your body ONLY FROM THE NECK DOWN.   Do not use on face/ open                           Wound or open sores. Avoid contact with eyes, ears mouth and genitals (private parts).                       Wash face,  Genitals (private parts) with your normal soap.             6.  Wash thoroughly, paying special attention to the area where your surgery  will be performed.  7.  Thoroughly rinse your body with warm water from the neck down.  8.  DO NOT shower/wash with your normal soap after using and rinsing off  the CHG Soap.             9.  Pat yourself dry with a clean towel.            10.  Wear clean pajamas.            11.  Place clean sheets on your bed the night of your first shower and do not  sleep with pets. Day of Surgery : Do not apply any lotions/deodorants the morning of surgery.  Please wear clean clothes to the hospital/surgery center.  FAILURE TO FOLLOW THESE  INSTRUCTIONS MAY RESULT IN THE CANCELLATION OF YOUR SURGERY PATIENT SIGNATURE_________________________________  NURSE SIGNATURE__________________________________  ________________________________________________________________________

## 2019-01-02 ENCOUNTER — Ambulatory Visit: Payer: Self-pay | Admitting: Surgery

## 2019-01-03 ENCOUNTER — Other Ambulatory Visit (HOSPITAL_COMMUNITY)
Admission: RE | Admit: 2019-01-03 | Discharge: 2019-01-03 | Disposition: A | Discharge status: Home / Self Care | Payer: 59 | Source: Ambulatory Visit | Attending: Surgery | Admitting: Surgery

## 2019-01-03 DIAGNOSIS — Z01812 Encounter for preprocedural laboratory examination: Secondary | ICD-10-CM | POA: Diagnosis present

## 2019-01-03 DIAGNOSIS — Z20828 Contact with and (suspected) exposure to other viral communicable diseases: Secondary | ICD-10-CM | POA: Insufficient documentation

## 2019-01-04 ENCOUNTER — Encounter (HOSPITAL_COMMUNITY): Payer: Self-pay

## 2019-01-04 ENCOUNTER — Other Ambulatory Visit: Payer: Self-pay

## 2019-01-04 ENCOUNTER — Encounter (HOSPITAL_COMMUNITY)
Admission: RE | Admit: 2019-01-04 | Discharge: 2019-01-04 | Disposition: A | Discharge status: Home / Self Care | Payer: 59 | Source: Ambulatory Visit | Attending: Surgery | Admitting: Surgery

## 2019-01-04 DIAGNOSIS — Z01812 Encounter for preprocedural laboratory examination: Secondary | ICD-10-CM | POA: Insufficient documentation

## 2019-01-04 LAB — BASIC METABOLIC PANEL
Anion gap: 7 (ref 5–15)
BUN: 28 mg/dL — ABNORMAL HIGH (ref 6–20)
CO2: 31 mmol/L (ref 22–32)
Calcium: 9.6 mg/dL (ref 8.9–10.3)
Chloride: 103 mmol/L (ref 98–111)
Creatinine, Ser: 1.38 mg/dL — ABNORMAL HIGH (ref 0.61–1.24)
GFR calc Af Amer: >60 mL/min (ref >60)
GFR calc non Af Amer: 58 mL/min — ABNORMAL LOW (ref >60)
Glucose, Bld: 114 mg/dL — ABNORMAL HIGH (ref 70–99)
Potassium: 5.1 mmol/L (ref 3.5–5.1)
Sodium: 141 mmol/L (ref 135–145)

## 2019-01-04 LAB — NOVEL CORONAVIRUS, NAA (HOSP ORDER, SEND-OUT TO REF LAB; TAT 18-24 HRS): SARS-CoV-2, NAA: NOT DETECTED

## 2019-01-04 LAB — CBC
HCT: 43.8 % (ref 39.0–52.0)
Hemoglobin: 13.9 g/dL (ref 13.0–17.0)
MCH: 29.9 pg (ref 26.0–34.0)
MCHC: 31.7 g/dL (ref 30.0–36.0)
MCV: 94.2 fL (ref 80.0–100.0)
Platelets: 305 10*3/uL (ref 150–400)
RBC: 4.65 MIL/uL (ref 4.22–5.81)
RDW: 12.3 % (ref 11.5–15.5)
WBC: 4.6 10*3/uL (ref 4.0–10.5)
nRBC: 0 % (ref 0.0–0.2)

## 2019-01-04 NOTE — Progress Notes (Signed)
PCP - Dr. Oneida Arenas Cardiologist -   Chest x-[place] - 08/23/18 EKG - no Stress Test - no ECHO - no Cardiac Cath - no Pulmonary function test-08/31/18  Sleep Study - no CPAP -   Fasting Blood Sugar - NA Checks Blood Sugar _____ times a day  Blood Thinner Instructions:ASA Aspirin Instructions:It was prescribed to him by a neurologist for a slight narrowing of a blood vessel in his head that was causing head aches Last Dose:11/29/18  Anesthesia review:   Patient denies shortness of breath, fever, cough and chest pain at PAT appointment yes  Patient verbalized understanding of instructions that were given to them at the PAT appointment. Patient was also instructed that they will need to review over the PAT instructions again at home before surgery. Yes Case was delayed because if an ear infection that is now clear.

## 2019-01-05 MED ORDER — DEXTROSE 5 % IV SOLN
3.0000 g | INTRAVENOUS | Status: AC
  Administered 2019-01-06: 3 g via INTRAVENOUS
  Filled 2019-01-05: qty 3

## 2019-01-05 NOTE — Anesthesia Preprocedure Evaluation (Addendum)
Anesthesia Evaluation  Patient identified by MRN, date of birth, ID band Patient awake    Reviewed: Allergy & Precautions, NPO status , Patient's Chart, lab work & pertinent test results  History of Anesthesia Complications (+) PONV and history of anesthetic complications  Airway Mallampati: II  TM Distance: >3 FB Neck ROM: Full    Dental no notable dental hx.    Pulmonary neg pulmonary ROS,    Pulmonary exam normal        Cardiovascular negative cardio ROS Normal cardiovascular exam     Neuro/Psych Anxiety S/p cervical fusion    GI/Hepatic Neg liver ROS, GERD  Medicated and Controlled,  Endo/Other  Hypothyroidism   Renal/GU negative Renal ROS  negative genitourinary   Musculoskeletal negative musculoskeletal ROS (+)   Abdominal   Peds  Hematology negative hematology ROS (+)   Anesthesia Other Findings Day of surgery medications reviewed with patient.  Reproductive/Obstetrics negative OB ROS                            Anesthesia Physical Anesthesia Plan  ASA: II  Anesthesia Plan: General   Post-op Pain Management:    Induction: Intravenous  PONV Risk Score and Plan: 4 or greater and Treatment may vary due to age or medical condition, Ondansetron, Dexamethasone, Midazolam and Propofol infusion  Airway Management Planned: Oral ETT  Additional Equipment: None  Intra-op Plan:   Post-operative Plan: Extubation in OR  Informed Consent: I have reviewed the patients History and Physical, chart, labs and discussed the procedure including the risks, benefits and alternatives for the proposed anesthesia with the patient or authorized representative who has indicated his/her understanding and acceptance.     Dental advisory given  Plan Discussed with: CRNA  Anesthesia Plan Comments:        Anesthesia Quick Evaluation

## 2019-01-06 ENCOUNTER — Encounter (HOSPITAL_COMMUNITY): Payer: Self-pay | Admitting: Surgery

## 2019-01-06 ENCOUNTER — Ambulatory Visit (HOSPITAL_COMMUNITY): Payer: 59 | Admitting: Physician Assistant

## 2019-01-06 ENCOUNTER — Ambulatory Visit (HOSPITAL_COMMUNITY)
Admission: RE | Admit: 2019-01-06 | Discharge: 2019-01-06 | Disposition: A | Discharge status: Home / Self Care | Payer: 59 | Attending: Surgery | Admitting: Surgery

## 2019-01-06 ENCOUNTER — Ambulatory Visit (HOSPITAL_COMMUNITY): Payer: 59 | Admitting: Anesthesiology

## 2019-01-06 ENCOUNTER — Encounter (HOSPITAL_COMMUNITY)
Admission: RE | Disposition: A | Discharge status: Home / Self Care | Payer: Self-pay | Source: Home / Self Care | Attending: Surgery

## 2019-01-06 DIAGNOSIS — E785 Hyperlipidemia, unspecified: Secondary | ICD-10-CM | POA: Diagnosis not present

## 2019-01-06 DIAGNOSIS — K429 Umbilical hernia without obstruction or gangrene: Secondary | ICD-10-CM | POA: Insufficient documentation

## 2019-01-06 DIAGNOSIS — E039 Hypothyroidism, unspecified: Secondary | ICD-10-CM | POA: Diagnosis not present

## 2019-01-06 DIAGNOSIS — I10 Essential (primary) hypertension: Secondary | ICD-10-CM | POA: Diagnosis not present

## 2019-01-06 DIAGNOSIS — K219 Gastro-esophageal reflux disease without esophagitis: Secondary | ICD-10-CM | POA: Diagnosis not present

## 2019-01-06 HISTORY — PX: UMBILICAL HERNIA REPAIR: SHX196

## 2019-01-06 SURGERY — REPAIR, HERNIA, UMBILICAL, ADULT
Anesthesia: General | Site: Abdomen

## 2019-01-06 MED ORDER — TRAMADOL HCL 50 MG PO TABS: 50.0000 mg | ORAL_TABLET | Freq: Four times a day (QID) | ORAL | 0 refills | Status: AC | PRN

## 2019-01-06 MED ORDER — OXYCODONE HCL 5 MG PO TABS
ORAL_TABLET | ORAL | Status: AC
  Filled 2019-01-06: qty 1

## 2019-01-06 MED ORDER — SUCCINYLCHOLINE CHLORIDE 200 MG/10ML IV SOSY
PREFILLED_SYRINGE | INTRAVENOUS | Status: AC
  Filled 2019-01-06: qty 10

## 2019-01-06 MED ORDER — ROCURONIUM BROMIDE 10 MG/ML (PF) SYRINGE
PREFILLED_SYRINGE | INTRAVENOUS | Status: AC
  Filled 2019-01-06: qty 10

## 2019-01-06 MED ORDER — LIDOCAINE 2% (20 MG/ML) 5 ML SYRINGE
INTRAMUSCULAR | Status: AC
  Filled 2019-01-06: qty 10

## 2019-01-06 MED ORDER — ONDANSETRON HCL 4 MG/2ML IJ SOLN
INTRAMUSCULAR | Status: DC | PRN
  Administered 2019-01-06: 4 mg via INTRAVENOUS

## 2019-01-06 MED ORDER — PROMETHAZINE HCL 25 MG/ML IJ SOLN
INTRAMUSCULAR | Status: AC
  Filled 2019-01-06: qty 1

## 2019-01-06 MED ORDER — PHENYLEPHRINE HCL (PRESSORS) 10 MG/ML IV SOLN
INTRAVENOUS | Status: DC | PRN
  Administered 2019-01-06 (×2): 80 ug via INTRAVENOUS

## 2019-01-06 MED ORDER — FENTANYL CITRATE (PF) 100 MCG/2ML IJ SOLN
INTRAMUSCULAR | Status: DC | PRN
  Administered 2019-01-06: 100 ug via INTRAVENOUS
  Administered 2019-01-06: 50 ug via INTRAVENOUS
  Administered 2019-01-06 (×2): 100 ug via INTRAVENOUS

## 2019-01-06 MED ORDER — FENTANYL CITRATE (PF) 250 MCG/5ML IJ SOLN
INTRAMUSCULAR | Status: AC
  Filled 2019-01-06: qty 5

## 2019-01-06 MED ORDER — KETOROLAC TROMETHAMINE 30 MG/ML IJ SOLN
INTRAMUSCULAR | Status: DC | PRN
  Administered 2019-01-06: 30 mg via INTRAVENOUS

## 2019-01-06 MED ORDER — 0.9 % SODIUM CHLORIDE (POUR BTL) OPTIME
TOPICAL | Status: DC | PRN
  Administered 2019-01-06: 1000 mL

## 2019-01-06 MED ORDER — SUCCINYLCHOLINE CHLORIDE 20 MG/ML IJ SOLN
INTRAMUSCULAR | Status: DC | PRN
  Administered 2019-01-06: 160 mg via INTRAVENOUS

## 2019-01-06 MED ORDER — PROPOFOL 10 MG/ML IV BOLUS
INTRAVENOUS | Status: AC
  Filled 2019-01-06: qty 40

## 2019-01-06 MED ORDER — ACETAMINOPHEN 500 MG PO TABS: 1000.0000 mg | ORAL_TABLET | ORAL | Status: DC

## 2019-01-06 MED ORDER — LACTATED RINGERS IV SOLN
INTRAVENOUS | Status: DC
  Administered 2019-01-06 (×2): via INTRAVENOUS

## 2019-01-06 MED ORDER — SCOPOLAMINE 1 MG/3DAYS TD PT72
MEDICATED_PATCH | TRANSDERMAL | Status: AC
  Filled 2019-01-06: qty 1

## 2019-01-06 MED ORDER — SCOPOLAMINE 1 MG/3DAYS TD PT72
MEDICATED_PATCH | TRANSDERMAL | Status: DC | PRN
  Administered 2019-01-06: 1 mg via TRANSDERMAL

## 2019-01-06 MED ORDER — KETOROLAC TROMETHAMINE 30 MG/ML IJ SOLN
INTRAMUSCULAR | Status: AC
  Filled 2019-01-06: qty 1

## 2019-01-06 MED ORDER — LIDOCAINE HCL (CARDIAC) PF 100 MG/5ML IV SOSY
PREFILLED_SYRINGE | INTRAVENOUS | Status: DC | PRN
  Administered 2019-01-06: 100 mg via INTRAVENOUS
  Administered 2019-01-06: 25 mg via INTRAVENOUS

## 2019-01-06 MED ORDER — CHLORHEXIDINE GLUCONATE CLOTH 2 % EX PADS: 6.0000 | MEDICATED_PAD | Freq: Once | CUTANEOUS | Status: DC

## 2019-01-06 MED ORDER — DEXAMETHASONE SODIUM PHOSPHATE 10 MG/ML IJ SOLN
INTRAMUSCULAR | Status: DC | PRN
  Administered 2019-01-06: 10 mg via INTRAVENOUS

## 2019-01-06 MED ORDER — FENTANYL CITRATE (PF) 100 MCG/2ML IJ SOLN: 25.0000 ug | INTRAMUSCULAR | Status: DC | PRN

## 2019-01-06 MED ORDER — OXYCODONE HCL 5 MG PO TABS
5.0000 mg | ORAL_TABLET | Freq: Once | ORAL | Status: AC | PRN
  Administered 2019-01-06: 5 mg via ORAL

## 2019-01-06 MED ORDER — ROCURONIUM 10MG/ML (10ML) SYRINGE FOR MEDFUSION PUMP - OPTIME
INTRAVENOUS | Status: DC | PRN
  Administered 2019-01-06: 50 mg via INTRAVENOUS
  Administered 2019-01-06: 20 mg via INTRAVENOUS

## 2019-01-06 MED ORDER — DEXAMETHASONE SODIUM PHOSPHATE 10 MG/ML IJ SOLN
INTRAMUSCULAR | Status: AC
  Filled 2019-01-06: qty 1

## 2019-01-06 MED ORDER — PHENYLEPHRINE 40 MCG/ML (10ML) SYRINGE FOR IV PUSH (FOR BLOOD PRESSURE SUPPORT)
PREFILLED_SYRINGE | INTRAVENOUS | Status: AC
  Filled 2019-01-06: qty 10

## 2019-01-06 MED ORDER — PROPOFOL 10 MG/ML IV BOLUS
INTRAVENOUS | Status: DC | PRN
  Administered 2019-01-06: 200 ug via INTRAVENOUS
  Administered 2019-01-06 (×2): 50 ug via INTRAVENOUS

## 2019-01-06 MED ORDER — OXYCODONE HCL 5 MG/5ML PO SOLN: 5.0000 mg | Freq: Once | ORAL | Status: AC | PRN

## 2019-01-06 MED ORDER — PROMETHAZINE HCL 25 MG/ML IJ SOLN
6.2500 mg | INTRAMUSCULAR | Status: DC | PRN
  Administered 2019-01-06: 6.25 mg via INTRAVENOUS

## 2019-01-06 MED ORDER — ACETAMINOPHEN 500 MG PO TABS
1000.0000 mg | ORAL_TABLET | ORAL | Status: AC
  Administered 2019-01-06: 1000 mg via ORAL
  Filled 2019-01-06: qty 2

## 2019-01-06 MED ORDER — MIDAZOLAM HCL 5 MG/5ML IJ SOLN
INTRAMUSCULAR | Status: DC | PRN
  Administered 2019-01-06: 2 mg via INTRAVENOUS

## 2019-01-06 MED ORDER — SUGAMMADEX SODIUM 500 MG/5ML IV SOLN
INTRAVENOUS | Status: AC
  Filled 2019-01-06: qty 5

## 2019-01-06 MED ORDER — ONDANSETRON HCL 4 MG/2ML IJ SOLN
INTRAMUSCULAR | Status: AC
  Filled 2019-01-06: qty 2

## 2019-01-06 MED ORDER — MIDAZOLAM HCL 2 MG/2ML IJ SOLN
INTRAMUSCULAR | Status: AC
  Filled 2019-01-06: qty 2

## 2019-01-06 MED ORDER — BUPIVACAINE HCL (PF) 0.25 % IJ SOLN
INTRAMUSCULAR | Status: DC | PRN
  Administered 2019-01-06: 30 mL

## 2019-01-06 MED ORDER — BUPIVACAINE HCL (PF) 0.25 % IJ SOLN
INTRAMUSCULAR | Status: AC
  Filled 2019-01-06: qty 30

## 2019-01-06 MED ORDER — SUGAMMADEX SODIUM 500 MG/5ML IV SOLN
INTRAVENOUS | Status: DC | PRN
  Administered 2019-01-06: 300 mg via INTRAVENOUS

## 2019-01-06 SURGICAL SUPPLY — 25 items
CHLORAPREP W/TINT 26 (MISCELLANEOUS) ×3 IMPLANT
COVER SURGICAL LIGHT HANDLE (MISCELLANEOUS) ×3 IMPLANT
COVER WAND RF STERILE (DRAPES) IMPLANT
DERMABOND ADVANCED (GAUZE/BANDAGES/DRESSINGS) ×2
DERMABOND ADVANCED .7 DNX12 (GAUZE/BANDAGES/DRESSINGS) ×1 IMPLANT
DRAPE LAPAROSCOPIC ABDOMINAL (DRAPES) ×3 IMPLANT
DRSG TEGADERM 4X4.75 (GAUZE/BANDAGES/DRESSINGS) ×3 IMPLANT
GAUZE SPONGE 4X4 12PLY STRL (GAUZE/BANDAGES/DRESSINGS) ×3 IMPLANT
GLOVE BIO SURGEON STRL SZ7.5 (GLOVE) ×3 IMPLANT
GLOVE INDICATOR 8.0 STRL GRN (GLOVE) ×3 IMPLANT
GOWN STRL REUS W/TWL XL LVL3 (GOWN DISPOSABLE) ×6 IMPLANT
KIT BASIN OR (CUSTOM PROCEDURE TRAY) ×3 IMPLANT
KIT TURNOVER KIT A (KITS) IMPLANT
MESH VENTRALEX ST 1-7/10 CRC S (Mesh General) ×3 IMPLANT
NEEDLE HYPO 22GX1.5 SAFETY (NEEDLE) ×3 IMPLANT
PACK GENERAL/GYN (CUSTOM PROCEDURE TRAY) ×3 IMPLANT
PENCIL SMOKE EVACUATOR (MISCELLANEOUS) ×3 IMPLANT
SPONGE LAP 18X18 RF (DISPOSABLE) IMPLANT
SUT ETHIBOND 0 MO6 C/R (SUTURE) ×3 IMPLANT
SUT MNCRL AB 4-0 PS2 18 (SUTURE) ×3 IMPLANT
SUT VIC AB 3-0 SH 27 (SUTURE) ×4
SUT VIC AB 3-0 SH 27X BRD (SUTURE) ×1 IMPLANT
SUT VIC AB 3-0 SH 27XBRD (SUTURE) ×1 IMPLANT
SYR 20ML LL LF (SYRINGE) ×3 IMPLANT
TOWEL OR 17X26 10 PK STRL BLUE (TOWEL DISPOSABLE) ×3 IMPLANT

## 2019-01-06 NOTE — Anesthesia Postprocedure Evaluation (Signed)
Anesthesia Post Note  Patient: Sean Dickson  Procedure(s) Performed: OPEN UMBILICAL HERNIA REPAIR WITH MESH (N/A Abdomen)     Patient location during evaluation: PACU Anesthesia Type: General Level of consciousness: awake and alert and oriented Pain management: pain level controlled Vital Signs Assessment: post-procedure vital signs reviewed and stable Respiratory status: spontaneous breathing, nonlabored ventilation and respiratory function stable Cardiovascular status: blood pressure returned to baseline Postop Assessment: no apparent nausea or vomiting Anesthetic complications: no    Last Vitals:  Vitals:   01/06/19 1015 01/06/19 1030  BP: 115/80 123/76  Pulse: 86 74  Resp: 13 13  Temp:  (!) 36.4 C  SpO2: 100% 94%    Last Pain:  Vitals:   01/06/19 1030  TempSrc:   PainSc: [place]

## 2019-01-06 NOTE — H&P (Signed)
CC: Umbilical bulge and soreness, here today for surgery  HPI: Sean Dickson is a very pleasant 2yoM with hx of HTN, HLD, hypothyroidism, GERD whom presented to our office for evaluation of an umbilical bulge which she first noticed about 8 months ago. Over the last 8 months, it has become more prominent and he has had discomfort related to it. He described multiple episodes per week of soreness and pain radiating from his umbilicus across his lower abdomen. This pain is improving when he lies down and gently reduces the small bulge he has. He denied any history of this being stuck or incarcerated. He denied any fevers/chills/nausea/vomiting or overlying skin changes. He'll occasionally take over-the-counter analgesics to help with the discomfort.  PMH: HTN, HLD (well controlled with diet and fenofibrate), GERD (well controlled with PPI); hypothyroidism (well controlled with Synthroid)  PSH: Open right inguinal hernia, years ago by Dr. Hulen Skains.  FHx: Denies FHx of malignancy  Social: Denies use of tobacco/EtOH/drugs. He works for [employer] - works up on the [employer]  ROS: A comprehensive 10 system review of systems was completed with the patient and pertinent findings as noted above.  Past Medical History:  Diagnosis Date  . Allergic rhinitis   . Anxiety attack   . Anxiety state, unspecified    with attacks  . Atypical chest pain   . Esophageal spasm   . GERD (gastroesophageal reflux disease)    schatzki ring  . H/O adenomatous polyp of colon   . History of kidney stones   . History of panic attacks   . Hypercholesteremia   . Hypothyroidism   . IBS (irritable bowel syndrome)   . Kidney stone   . 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 PONV (postoperative nausea and vomiting)   . Schatzki's ring     Past Surgical History:  Procedure Laterality Date  . anterior cervical repair    . INGUINAL HERNIA REPAIR Right 2005   . TONSILLECTOMY    . WISDOM TOOTH EXTRACTION      Family History  Problem Relation Age of Onset  . Other Mother        palpitations  . Macular degeneration Mother   . Non-Hodgkin's lymphoma Father   . Uterine cancer Sister   . Colon polyps Sister 21       4 adenoma    Social:  reports that he has never smoked. He has never used smokeless tobacco. He reports that he does not drink alcohol or use drugs.  Allergies:  Allergies  Allergen Reactions  . Atorvastatin Other (See Comments)    GERD, Myalgias  . Codeine     Stomach upset   . Nitroglycerin Other (See Comments)    Headaches  . Pravastatin Other (See Comments)    Myalgias  . Red Yeast Rice [Cholestin] Other (See Comments)    GERD     Medications: I have reviewed the patient's current medications.  Results for orders placed or performed during the hospital encounter of 01/04/19 (from the past 48 hour(s))  CBC     Status: None   Collection Time: 01/04/19  9:44 AM  Result Value Ref Range   WBC 4.6 4.0 - 10.5 K/uL   RBC 4.65 4.22 - 5.81 MIL/uL   Hemoglobin 13.9 13.0 - 17.0 g/dL   HCT 43.8 39.0 - 52.0 %   MCV 94.2 80.0 - 100.0 fL   MCH 29.9 26.0 - 34.0 pg  MCHC 31.7 30.0 - 36.0 g/dL   RDW 12.3 11.5 - 15.5 %   Platelets 305 150 - 400 K/uL   nRBC 0.0 0.0 - 0.2 %    Comment: Performed at [facility], [place] [address], [place], [place] [id number]  Basic metabolic panel     Status: Abnormal   Collection Time: 01/04/19  9:44 AM  Result Value Ref Range   Sodium 141 135 - 145 mmol/L   Potassium 5.1 3.5 - 5.1 mmol/L   Chloride 103 98 - 111 mmol/L   CO2 31 22 - 32 mmol/L   Glucose, Bld 114 (H) 70 - 99 mg/dL   BUN 28 (H) 6 - 20 mg/dL   Creatinine, Ser 1.38 (H) 0.61 - 1.24 mg/dL   Calcium 9.6 8.9 - 10.3 mg/dL   GFR calc non Af Amer 58 (L) >60 mL/min   GFR calc Af Amer >60 >60 mL/min   Anion gap 7 5 - 15    Comment: Performed at [facility], [place] [address] [place], [place]  [address]    No results found.  ROS - all of the below systems have been reviewed with the patient and positives are indicated with bold text General: chills, fever or night sweats Eyes: blurry vision or double vision ENT: epistaxis or sore throat Allergy/Immunology: itchy/watery eyes or nasal congestion Hematologic/Lymphatic: bleeding problems, blood clots or swollen lymph nodes Endocrine: temperature intolerance or unexpected weight changes Breast: new or changing breast lumps or nipple discharge Resp: cough, shortness of breath, or wheezing CV: chest pain or dyspnea on exertion GI: as per HPI GU: dysuria, trouble voiding, or hematuria MSK: joint pain or joint stiffness Neuro: TIA or stroke symptoms Derm: pruritus and skin lesion changes Psych: anxiety and depression  PE Blood pressure 124/83, pulse 87, temperature 98.5 F (36.9 C), temperature source Oral, resp. rate 18, height 6\' 3"  (1.905 m), weight 133.4 kg, SpO2 97 %. Constitutional: NAD; conversant; no deformities Eyes: Moist conjunctiva; no lid lag; anicteric; PERRL Neck: Trachea midline; no thyromegaly Lungs: Normal respiratory effort; no tactile fremitus CV: RRR; no palpable thrills; no pitting edema GI: Abdomen soft, nontender, nondistended; no palpable hepatosplenomegaly. Small reducible umbilical bulge without skin changes. Fascial defect feels small than finger tip. No periumbilical skin changes MSK: Normal gait; no clubbing/cyanosis Psychiatric: Appropriate affect; alert and oriented x3 Lymphatic: No palpable cervical or axillary lymphadenopathy  Results for orders placed or performed during the hospital encounter of 01/04/19 (from the past 48 hour(s))  CBC     Status: None   Collection Time: 01/04/19  9:44 AM  Result Value Ref Range   WBC 4.6 4.0 - 10.5 K/uL   RBC 4.65 4.22 - 5.81 MIL/uL   Hemoglobin 13.9 13.0 - 17.0 g/dL   HCT 43.8 39.0 - 52.0 %   MCV 94.2 80.0 - 100.0 fL   MCH 29.9 26.0 - 34.0 pg   MCHC  31.7 30.0 - 36.0 g/dL   RDW 12.3 11.5 - 15.5 %   Platelets 305 150 - 400 K/uL   nRBC 0.0 0.0 - 0.2 %    Comment: Performed at [facility], [place] [address], [place], [place] [id number]  Basic metabolic panel     Status: Abnormal   Collection Time: 01/04/19  9:44 AM  Result Value Ref Range   Sodium 141 135 - 145 mmol/L   Potassium 5.1 3.5 - 5.1 mmol/L   Chloride 103 98 - 111 mmol/L   CO2 31  22 - 32 mmol/L   Glucose, Bld 114 (H) 70 - 99 mg/dL   BUN 28 (H) 6 - 20 mg/dL   Creatinine, Ser 1.38 (H) 0.61 - 1.24 mg/dL   Calcium 9.6 8.9 - 10.3 mg/dL   GFR calc non Af Amer 58 (L) >60 mL/min   GFR calc Af Amer >60 >60 mL/min   Anion gap 7 5 - 15    Comment: Performed at [facility], [place] [address], [place], [place] [address]   A/P: Sean Dickson is a very pleasant 25yoM with hx of HTN, HLD, hypothyroidism, GERD here for surgery for his symptomatic umbilical hernia  -The anatomy and physiology of the GI tract and abdominal wall was discussed at length with the patient again today. The pathophysiology of hernias was discussed at length with as well -We discussed open umbilical hernia repair with possible mesh - based on size of defect intraoperatively but anticipate likely mesh placement. We discussed that without surgery, his symptoms are likely to persist and there is risk for incarceration/strangulation given his symptomatic nature -The planned procedure, material risks (including, but not limited to, pain, bleeding, infection, scarring, need for blood transfusion, damage to surrounding structures-blood vessels/nerves/viscus/organs, need for additional procedures, recurrence, chronic pain, mesh complications including erosion into other structures/vessels/organs/viscus, pneumonia, heart attack, stroke, death) benefits and alternatives to surgery were discussed at length. I noted a good probability that the procedure help improve his symptoms and the bulge he has  noticed; additionally reduce risk of long-term complications related to having a hernia like incarceration/strangulation of contents. The patient's questions were answered to his satisfaction, he voiced understanding and they elected to proceed with surgery. Additionally, we discussed typical postoperative expectations and the recovery process.  Sharon Mt. Dema Severin, M.D. [facility]

## 2019-01-06 NOTE — Discharge Instructions (Signed)
POST OP INSTRUCTIONS  1. DIET: As tolerated. Follow a light bland diet the first 24 hours after arrival home, such as soup, liquids, crackers, etc.  Be sure to include lots of fluids daily.  Avoid fast food or heavy meals as your are more likely to get nauseated.  Eat a low fat the next few days after surgery.  2. Take your usually prescribed home medications unless otherwise directed.  3. PAIN CONTROL: a. Pain is best controlled by a usual combination of three different methods TOGETHER: i. Ice/Heat ii. Over the counter pain medication iii. Prescription pain medication b. Most patients will experience some swelling and bruising around the surgical site.  Ice packs or heating pads (30-60 minutes up to 6 times a day) will help. Some people prefer to use ice alone, heat alone, alternating between ice & heat.  Experiment to what works for you.  Swelling and bruising can take several weeks to resolve.   c. It is helpful to take an over-the-counter pain medication regularly for the first few weeks: i. Ibuprofen (Motrin/Advil) - 200mg  tabs - take 3 tabs (600mg ) every 6 hours as needed for pain ii. Acetaminophen (Tylenol) - you may take 650mg  every 6 hours as needed. You can take this with motrin as they act differently on the body. If you are taking a narcotic pain medication that has acetaminophen in it, do not take over the counter tylenol at the same time.  Iii. NOTE: You may take both of these medications together - most patients  find it most helpful when alternating between the two (i.e. Ibuprofen at 6am,  tylenol at 9am, ibuprofen at 12pm ...) d. A  prescription for pain medication should be given to you upon discharge.  Take your pain medication as prescribed if your pain is not adequatly controlled with the over-the-counter pain reliefs mentioned above.  4. Avoid getting constipated.  Between the surgery and the pain medications, it is common to experience some constipation.  Increasing fluid  intake and taking a fiber supplement (such as Metamucil, Citrucel, FiberCon, MiraLax, etc) 1-2 times a day regularly will usually help prevent this problem from occurring.  A mild laxative (prune juice, Milk of Magnesia, MiraLax, etc) should be taken according to package directions if there are no bowel movements after 48 hours.    5. Dressing: Your incision is covered in a sterile dressing which you may remove 01/08/2019. The skin incision is covered in Dermabond which is like sterile superglue for the skin. This will come off on it's own in a couple weeks. It is waterproof and you may bathe normally starting the day after your surgery in a shower. You may get the umbilical area wet in the shower including soap/water over the incision beginning 48 hours after your surgery. Avoid baths/pools/lakes/oceans until your wounds have fully healed.  6. ACTIVITIES as tolerated:   a. Avoid heavy lifting (>10lbs or 1 gallon of milk) for the next 6 weeks. b. You may resume regular (light) daily activities beginning the next day--such as daily self-care, walking, climbing stairs--gradually increasing activities as tolerated.  If you can walk 30 minutes without difficulty, it is safe to try more intense activity such as jogging, treadmill, bicycling, low-impact aerobics.  c. DO NOT PUSH THROUGH PAIN.  Let pain be your guide: If it hurts to do something, don't do it. d. Dennis Bast may drive when you are no longer taking prescription pain medication, you can comfortably wear a seatbelt, and you can safely maneuver  your car and apply brakes. e. Dennis Bast may have sexual intercourse when it is comfortable.   7. FOLLOW UP in our office a. Please call [facility] at [phone] to set up an appointment to see your surgeon in the office for a follow-up appointment approximately 2 weeks after your surgery. b. Make sure that you call for this appointment the day you arrive home to insure a convenient appointment time.  9. If you have  disability or family leave forms that need to be completed, you may have them completed by your primary care physician's office; for return to work instructions, please ask our office staff and they will be happy to assist you in obtaining this documentation   When to call us [phone]: 1. Poor pain control 2. Reactions / problems with new medications (rash/itching, etc)  3. Fever over 101.5 F (38.5 C) 4. Inability to urinate 5. Nausea/vomiting 6. Worsening swelling or bruising 7. Continued bleeding from incision. 8. Increased pain, redness, or drainage from the incision  The clinic staff is available to answer your questions during regular business hours (8:30am-5pm).  Please dont hesitate to call and ask to speak to one of our nurses for clinical concerns.   A surgeon from [facility] is always on call at the hospitals   If you have a medical emergency, go to the nearest emergency room or call 911.  [facility], [place] [address] [place], [place],   [address] MAIN: [phone] FAX: [phone] [email or web address]

## 2019-01-06 NOTE — Anesthesia Procedure Notes (Signed)
Procedure Name: Intubation Date/Time: 01/06/2019 7:41 AM Performed by: Lissa Morales, CRNA Pre-anesthesia Checklist: Patient identified, Emergency Drugs available, Suction available and Patient being monitored Patient Re-evaluated:Patient Re-evaluated prior to induction Oxygen Delivery Method: Circle system utilized Preoxygenation: Pre-oxygenation with 100% oxygen Induction Type: IV induction Ventilation: Mask ventilation without difficulty Laryngoscope Size: Glidescope and 4 Grade View: Grade III Tube type: Oral Tube size: 8.0 mm Number of attempts: 1 Airway Equipment and Method: Stylet and Oral airway Placement Confirmation: ETT inserted through vocal cords under direct vision,  positive ETCO2 and breath sounds checked- equal and bilateral Secured at: 26 cm Tube secured with: Tape Dental Injury: Teeth and Oropharynx as per pre-operative assessment

## 2019-01-06 NOTE — Transfer of Care (Addendum)
Immediate Anesthesia Transfer of Care Note  Patient: Sean Dickson  Procedure(s) Performed: OPEN UMBILICAL HERNIA REPAIR WITH MESH (N/A Abdomen)  Patient Location: PACU  Anesthesia Type:General  Level of Consciousness: awake, alert , oriented and patient cooperative  Airway & Oxygen Therapy: Patient Spontanous Breathing and Patient connected to face mask oxygen  Post-op Assessment: Report given to RN, Post -op Vital signs reviewed and stable and Patient moving all extremities X 4  Post vital signs: stable  Last Vitals:  Vitals Value Taken Time  BP 114/75 01/06/19 0930  Temp    Pulse 78 01/06/19 0933  Resp 13 01/06/19 0933  SpO2 89 % 01/06/19 0933  Vitals shown include unvalidated device data.  Last Pain:  Vitals:   01/06/19 0629  TempSrc:   PainSc: 0-No pain         Complications: No apparent anesthesia complications

## 2019-01-06 NOTE — Op Note (Signed)
01/06/2019  9:39 AM  PATIENT:  Sean Dickson  53 y.o. male  Patient Care Team: Gaynelle Arabian, MD as PCP - General (Family Medicine)  PRE-OPERATIVE DIAGNOSIS:  Symptomatic umbilical hernia  POST-OPERATIVE DIAGNOSIS:  Same  PROCEDURE:  Open umbilical hernia repair with mesh  SURGEON:  Sharon [place] Marcucci, MD  ASSISTANT: none  ANESTHESIA:   local and general  COUNTS:  Sponge, needle and instrument counts were reported correct x2 at the conclusion of the operation.  EBL: 5 mL  DRAINS: None  SPECIMEN: None  COMPLICATIONS: None  FINDINGS: Umbilical hernia which contained preperitoneal fat. Fascial defect was 2 cm in size and therefore mesh repair was selected. A 4.3 cm round Ventralex ST hernia mesh was used and placed with visceral side down and additionally in the preperitoneal space. Defect was then obliterated with interrupted suture as well.  DISPOSITION: PACU in satisfactory condition  INDICATION: Mr. Caruth is a very pleasant pleasant 10yoM with hx of HTN, HLD, hypothyroidism, GERD whom presented to our office for evaluation of an umbilical bulge which he first noticed about 8 months ago. Over the last 8 months, it has become more prominent and he has had discomfort related to it. He described multiple episodes per week of soreness and pain radiating from his umbilicus across his lower abdomen. This pain is improving when he lies down and gently reduces the small bulge he has. He denied any history of this being stuck or incarcerated. He denied any fevers/chills/nausea/vomiting or overlying skin changes. He'll occasionally take over-the-counter analgesics to help with the discomfort.  He was found to have a soft reducible umbilical hernia. We had discussed options moving forward. He is a nonsmoker with BMI 36.  We had discussed option moving forward and he opted to proceed with surgery. We had discussed mesh placement if defect was found to be at or above 1 cm in size  intraoperatively. We discussed and provided reading materials on mesh reinforcement surgery as well.  DESCRIPTION: The patient was identified in preop holding and taken to the OR where he was placed on the operating room table. SCDs were placed. General endotracheal anesthesia was induced without difficulty. Hair on the abdomen was then clipped by the OR team. He was then prepped and draped in the usual sterile fashion. A surgical timeout was performed indicating the correct patient, procedure, positioning and need for preoperative antibiotics.  Local anesthetic consisting of 0.25% Marcaine without epinephrine was infiltrated into the infraumbilical soft tissues.  A curvilinear infraumbilical incision was created.  The subcutaneous tissue was carefully dissected.  The umbilical hernia was identified having herniated through his umbilical stalk.  This was then circumferentially bluntly dissected until the stalk was completely free circumferentially.  The interface between the dermis and the hernia sac was identified and carefully developed sharply.  The umbilical skin and subcutaneous tissue was then freed.  The fascia was then exposed by clearing the overlying subcutaneous tissue circumferentially.  The hernia sac which was empty was reduced.  The preperitoneal space had been developed bluntly.  The fascial defect measured 2 cm in size.  Given this size, the decision was made to proceed with mesh reinforcement for umbilical hernia repair.  A 4.3 cm round Ventralex ST hernia mesh was selected.  Anchoring sutures of 0 Ethibond were then placed at 12 and 6:00 in a U stitch manner.  The mesh was then parachuted down into the preperitoneal space.  The anchoring sutures were tied.  The fascia had been  well exposed circumferentially around the hernia defect.  The fascia was then closed over the mesh using interrupted 0 Ethibond suture.  The tails of the mesh were incorporated into the suture closures.  The mesh tails  were then trimmed.  The fascial defect was then palpated and noted to be completely obliterated.  The wound is then irrigated and hemostasis verified.  Sponge, needle, and instrument counts were reported correct.  3-0 Vicryl suture was then used to perform an umbilical plasty, tacking the dermis of the umbilical skin down to the fascia, taking care to stay in a superficial plane.  The incision was then closed in layers using 3-0 Vicryl deep dermal sutures followed by a running 4-0 Monocryl subcuticular suture.  All counts were again reported correct.  Additional local anesthetic was then infiltrated. Dermabond was then placed over the incision.  After the Dermabond dried, a dressing consisting of 4 x 4 gauze secured with a Tegaderm was placed.  He was then awakened from general anesthesia, extubated, and transferred to a stretcher for transport to PACU in satisfactory condition.

## 2019-01-07 ENCOUNTER — Encounter: Payer: Self-pay | Admitting: *Deleted

## 2019-03-07 IMAGING — MR MR LUMBAR SPINE W/O CM
4 of 5 series · 18 of 48 positions shown · non-contrast
Comparison: Radiography 06/11/2017.

CLINICAL DATA: Tingling in both lower extremities since [REDACTED].
Sensation particularly pronounced in the toes.

EXAM:
MRI LUMBAR SPINE WITHOUT CONTRAST
TECHNIQUE: Multiplanar, multisequence MR imaging of the lumbar spine was
performed. No intravenous contrast was administered.

[Series 6: T2 · sagittal · 4.0mm · 0.73mm/px · 6 of 17 slices shown (1 of 2)]
[im 1/17]
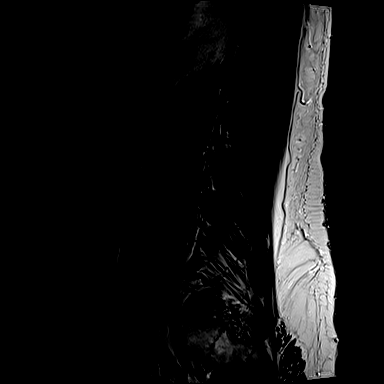
[im 4/17]
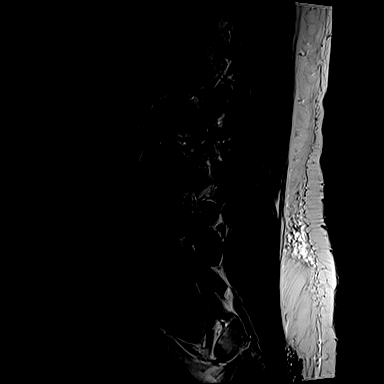
[im 7/17]
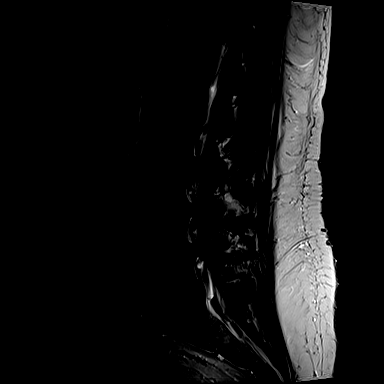
[im 10/17]
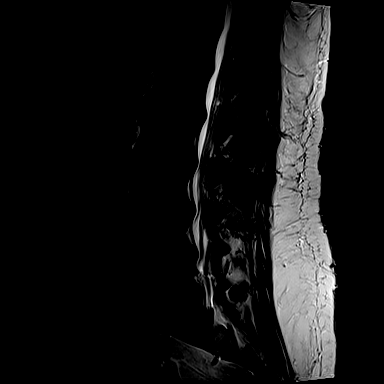
[im 13/17]
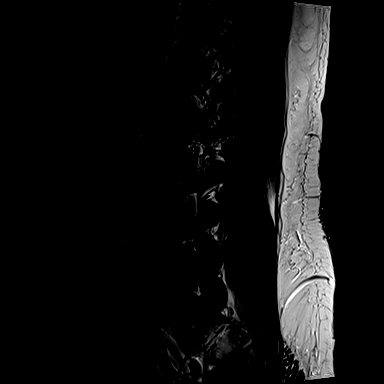
[im 17/17]
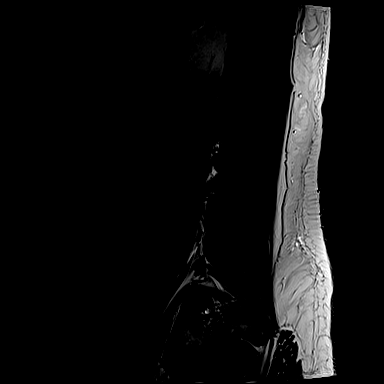

[Series 7: T1 · sagittal · 4.0mm · 0.73mm/px · 3 of 17 slices shown (1 of 2)]
[im 4/17]
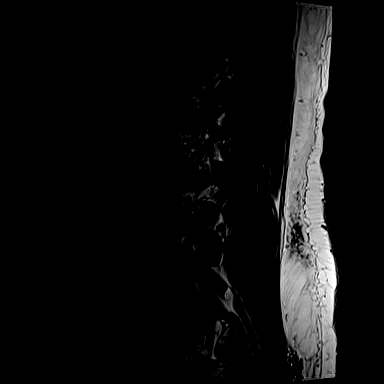
[im 10/17]
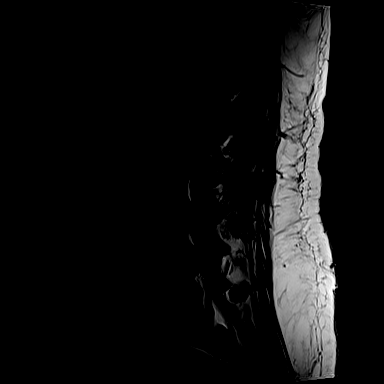
[im 17/17]
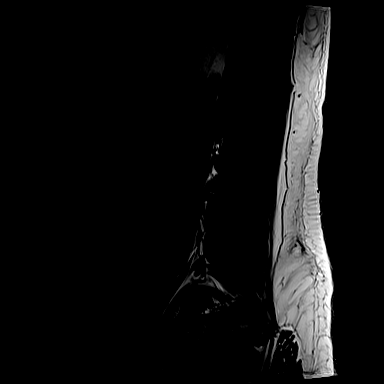

[Series 11: T1 · axial · 4.0mm · 0.28mm/px · z∈[-58,+104]mm · 3 of 44 slices shown (2 of 2)]
[im 7/44]
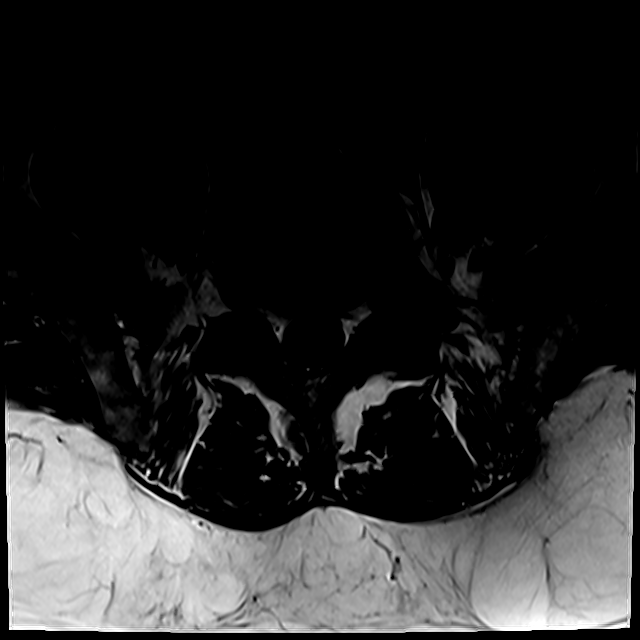
[im 22/44]
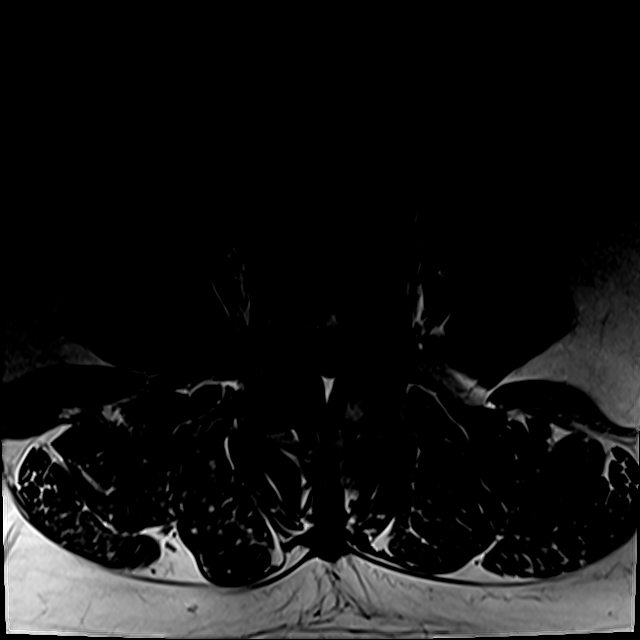
[im 37/44]
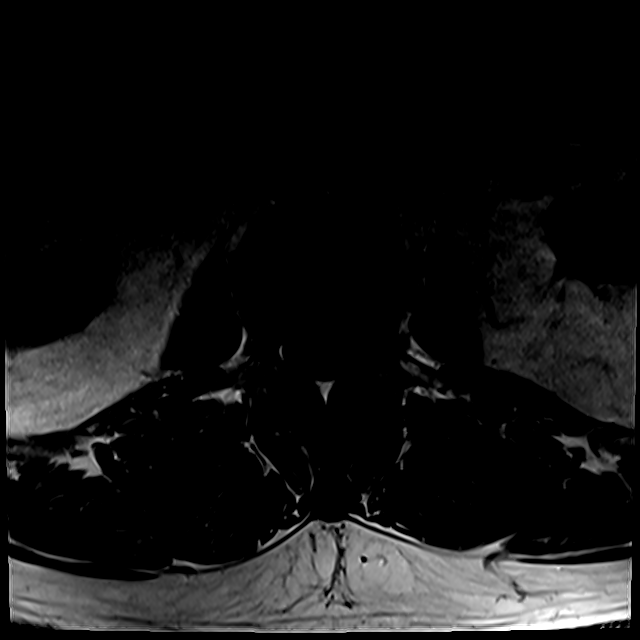

[Series 14: T2 · axial · 4.0mm · 0.28mm/px · z∈[-88,+104]mm · 6 of 44 slices shown (2 of 2)]
[im 1/44]
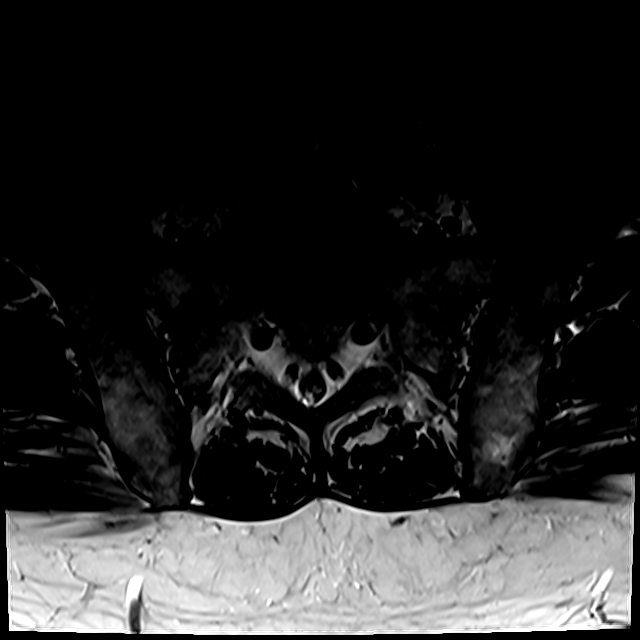
[im 7/44]
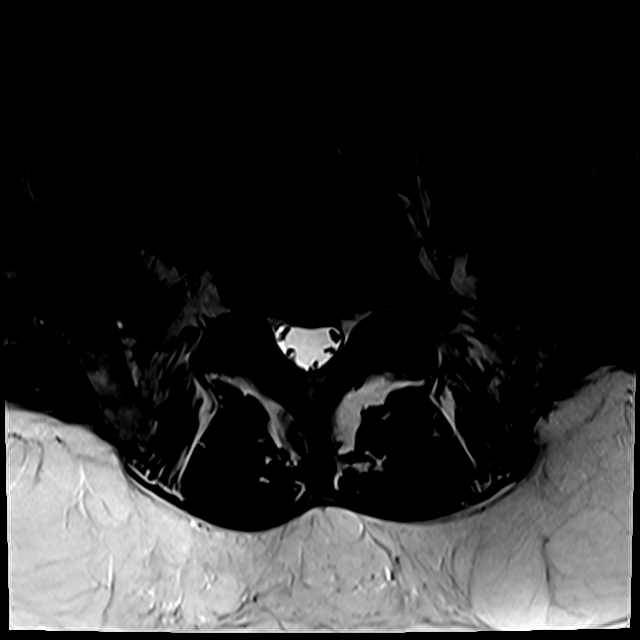
[im 13/44]
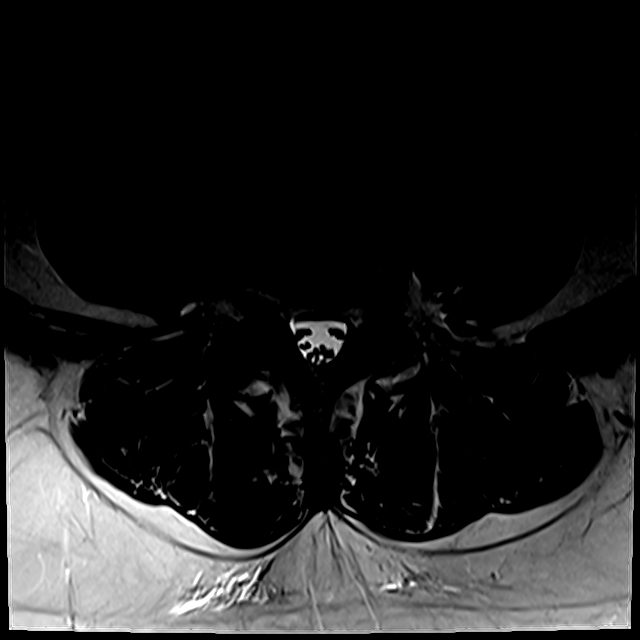
[im 19/44]
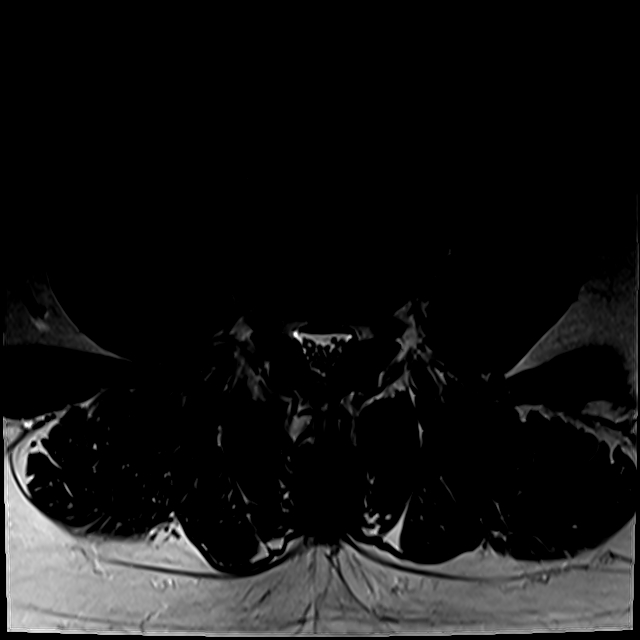
[im 22/44]
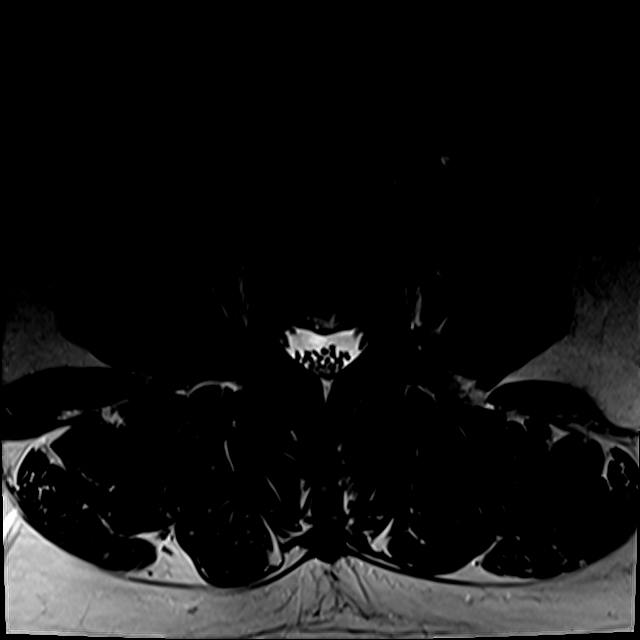
[im 37/44]
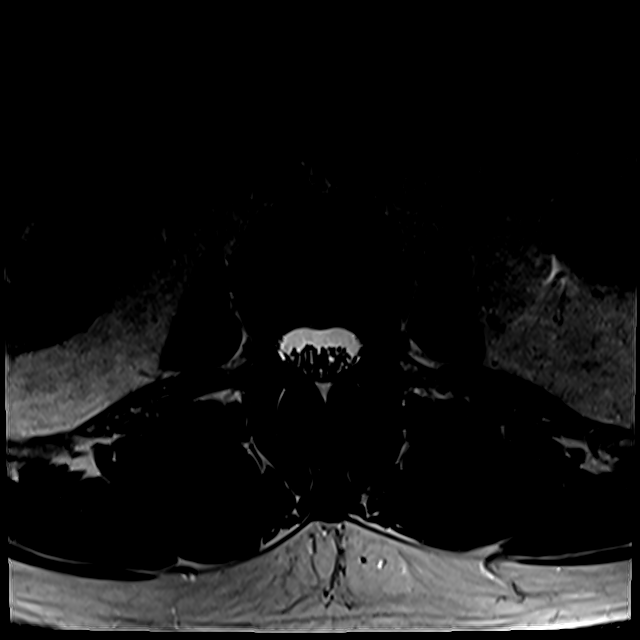

[18 of 48 positions shown; findings below may reference images not displayed]

FINDINGS: Segmentation:  5 lumbar type vertebral bodies.

Alignment:  Normal

Vertebrae: No fracture or primary bone lesion. Chronic discogenic
endplate changes at L4-5.

Conus medullaris and cauda equina: Conus extends to the L1 level.
Conus and cauda equina appear normal.

Paraspinal and other soft tissues: Negative

Disc levels:

T11-12 is normal.  T12-L1 shows a minimal noncompressive disc bulge.

L1-2: Mild bulging of the disc.  No compressive stenosis.

L2-3: Mild bulging of the disc. Mild facet hypertrophy. Mild
narrowing of the lateral recesses without likely neural compression.

L3-4: Moderate bulging of the disc. Mild facet and ligamentous
hypertrophy. Mild narrowing of the lateral recesses without likely
compressive stenosis.

L4-5: Chronic disc degeneration with loss of disc height. Endplate
discogenic marrow changes without any edematous change presently.
Endplate osteophytes and bulging of the disc. Mild facet and
ligamentous hypertrophy. Mild stenosis of both lateral recesses and
neural foramina. Foraminal narrowing is slightly more pronounced on
the left. No definite neural compression however.

L5-S1: Minimal bulging of the disc. Mild facet hypertrophy. No
compressive canal or foraminal stenosis.
IMPRESSION: Chronic appearing degenerative changes throughout the lumbar region.
There is no compressive central canal stenosis. There is mild
lateral recess narrowing at L3-4 at L4-5, but there is no definite
neural compression. Mild foraminal narrowing bilaterally at L3-4 and
left more than right at L4-5, again without definite neural
compression. It would be doubtful that the patient has tingling
sensation in the toes was secondary to these findings.

## 2021-02-02 ENCOUNTER — Encounter (HOSPITAL_BASED_OUTPATIENT_CLINIC_OR_DEPARTMENT_OTHER): Payer: Self-pay

## 2021-02-02 ENCOUNTER — Emergency Department (HOSPITAL_BASED_OUTPATIENT_CLINIC_OR_DEPARTMENT_OTHER)
Admission: EM | Admit: 2021-02-02 | Discharge: 2021-02-02 | Disposition: A | Discharge status: Home / Self Care | Payer: 59 | Attending: Emergency Medicine | Admitting: Emergency Medicine

## 2021-02-02 DIAGNOSIS — K580 Irritable bowel syndrome with diarrhea: Secondary | ICD-10-CM | POA: Diagnosis not present

## 2021-02-02 DIAGNOSIS — E039 Hypothyroidism, unspecified: Secondary | ICD-10-CM | POA: Insufficient documentation

## 2021-02-02 DIAGNOSIS — Z20822 Contact with and (suspected) exposure to covid-19: Secondary | ICD-10-CM | POA: Diagnosis not present

## 2021-02-02 DIAGNOSIS — R110 Nausea: Secondary | ICD-10-CM | POA: Diagnosis not present

## 2021-02-02 DIAGNOSIS — Z7982 Long term (current) use of aspirin: Secondary | ICD-10-CM | POA: Insufficient documentation

## 2021-02-02 DIAGNOSIS — R1084 Generalized abdominal pain: Secondary | ICD-10-CM | POA: Diagnosis not present

## 2021-02-02 LAB — COMPREHENSIVE METABOLIC PANEL
ALT: 17 U/L (ref 0–44)
AST: 17 U/L (ref 15–41)
Albumin: 4.6 g/dL (ref 3.5–5.0)
Alkaline Phosphatase: 35 U/L — ABNORMAL LOW (ref 38–126)
Anion gap: 7 (ref 5–15)
BUN: 25 mg/dL — ABNORMAL HIGH (ref 6–20)
CO2: 30 mmol/L (ref 22–32)
Calcium: 9.5 mg/dL (ref 8.9–10.3)
Chloride: 102 mmol/L (ref 98–111)
Creatinine, Ser: 1.29 mg/dL — ABNORMAL HIGH (ref 0.61–1.24)
GFR, Estimated: >60 mL/min (ref >60)
Glucose, Bld: 90 mg/dL (ref 70–99)
Potassium: 4.4 mmol/L (ref 3.5–5.1)
Sodium: 139 mmol/L (ref 135–145)
Total Bilirubin: 0.5 mg/dL (ref 0.3–1.2)
Total Protein: 7.6 g/dL (ref 6.5–8.1)

## 2021-02-02 LAB — CBC
HCT: 43.7 % (ref 39.0–52.0)
Hemoglobin: 14.2 g/dL (ref 13.0–17.0)
MCH: 29.8 pg (ref 26.0–34.0)
MCHC: 32.5 g/dL (ref 30.0–36.0)
MCV: 91.8 fL (ref 80.0–100.0)
Platelets: 315 10*3/uL (ref 150–400)
RBC: 4.76 MIL/uL (ref 4.22–5.81)
RDW: 12.4 % (ref 11.5–15.5)
WBC: 5.2 10*3/uL (ref 4.0–10.5)
nRBC: 0 % (ref 0.0–0.2)

## 2021-02-02 LAB — RESP PANEL BY RT-PCR (FLU A&B, COVID) ARPGX2
Influenza A by PCR: NEGATIVE
Influenza B by PCR: NEGATIVE
SARS Coronavirus 2 by RT PCR: NEGATIVE

## 2021-02-02 LAB — URINALYSIS, ROUTINE W REFLEX MICROSCOPIC
Bilirubin Urine: NEGATIVE
Glucose, UA: NEGATIVE mg/dL
Hgb urine dipstick: NEGATIVE
Ketones, ur: NEGATIVE mg/dL
Leukocytes,Ua: NEGATIVE
Nitrite: NEGATIVE
Protein, ur: NEGATIVE mg/dL
Specific Gravity, Urine: 1.024 (ref 1.005–1.030)
pH: 5.5 (ref 5.0–8.0)

## 2021-02-02 LAB — LIPASE, BLOOD: Lipase: 15 U/L (ref 11–51)

## 2021-02-02 NOTE — Discharge Instructions (Addendum)
You were seen in the emergency department for abdominal pain.  As we discussed I think your symptoms are likely related to irritable bowel or food intolerance.  Your lab work-up was all reassuring today.  You tested negative for COVID and flu.  I like you discussed this with your primary doctor, and discuss potential repeat endoscopy or colonoscopy with your GI doctor.  Continue to monitor how you're doing and return to the ER for new or worsening symptoms such as fever, chills, persistent diarrhea, change in your abdominal pain.   It has been a pleasure seeing and caring for you today and I hope you start feeling better soon!

## 2021-02-02 NOTE — ED Triage Notes (Addendum)
Pt c/o intermittent abdominal pain, nausea, and diarrhea since Monday.

## 2021-02-02 NOTE — ED Provider Notes (Signed)
[facility] Provider Note   CSN: [id number] Arrival date & time: 02/02/21  1248     History  Chief Complaint  Patient presents with   Abdominal Pain   Diarrhea   Nausea    Sean Dickson is a 56 y.o. male with history of GERD who presents emergency department with intermittent abdominal pain, and diarrhea since 1/2.  Patient states that his discomfort is made worse after large meals, and has subsequent episodes of diarrhea the following day.  He initially took Pepto-Bismol earlier this week, with moderate improvement.  He then took a dose of Imodium 2 days ago, with some improvement.  He was advised to discontinue these by family members who had concern for developing infection.  No one else at home is sick.  He reports increased stress in his life recently, due to having to take care of wife after recent surgery.   Abdominal Pain Associated symptoms: diarrhea   Associated symptoms: no chest pain, no chills, no constipation, no dysuria, no fever, no hematuria, no nausea, no shortness of breath and no vomiting   Diarrhea Associated symptoms: abdominal pain   Associated symptoms: no chills, no fever and no vomiting      Past Medical History:  Diagnosis Date   Allergic rhinitis    Anxiety attack    Anxiety state, unspecified    with attacks   Atypical chest pain    Esophageal spasm    GERD (gastroesophageal reflux disease)    schatzki ring   H/O adenomatous polyp of colon    History of kidney stones    History of panic attacks    Hypercholesteremia    Hypothyroidism    IBS (irritable bowel syndrome)    Kidney stone    Neuromuscular disorder ([place]) 2017   tingling in hands and toes bilat from pinch nerve   PONV (postoperative nausea and vomiting)    Schatzki's ring      Home Medications Prior to Admission medications   Medication Sig Start Date End Date Taking? Authorizing Provider  acetaminophen (TYLENOL) 500 MG tablet Take 500-1,000 mg by  mouth every 6 (six) hours as needed (for pain.).    [provider]  aspirin EC 81 MG tablet Take 81 mg by mouth daily.    [provider]  fenofibrate 160 MG tablet Take 160 mg by mouth daily.  11/18/12   [provider]  fluticasone (FLONASE) 50 MCG/ACT nasal spray Place 1 spray into the nose daily as needed for allergies.     [provider]  levothyroxine (SYNTHROID, LEVOTHROID) 100 MCG tablet Take 100 mcg by mouth daily before breakfast.  06/11/17   [provider]  Multiple Vitamin (MULTIVITAMIN WITH MINERALS) TABS tablet Take 1 tablet by mouth daily.    [provider]  omeprazole (PRILOSEC) 40 MG capsule Take 40 mg by mouth daily.  01/07/17   [provider]  Polyethyl Glycol-Propyl Glycol (SYSTANE) 0.4-0.3 % SOLN Place 1 drop into both eyes daily.    [provider]  Turmeric 500 MG CAPS Take 500 mg by mouth daily.    [provider]      Allergies    Atorvastatin, Codeine, Nitroglycerin, Pravastatin, and Red yeast rice [cholestin]    Review of Systems   Review of Systems  Constitutional:  Negative for chills and fever.  Respiratory:  Negative for shortness of breath.   Cardiovascular:  Negative for chest pain.  Gastrointestinal:  Positive for abdominal pain and  diarrhea. Negative for blood in stool, constipation, nausea and vomiting.  Genitourinary:  Negative for dysuria, flank pain, frequency, hematuria, scrotal swelling, testicular pain and urgency.  All other systems reviewed and are negative.  Physical Exam Updated Vital Signs BP 116/77    Pulse 63    Temp 98.2 F (36.8 C) (Oral)    Resp 16    Ht 6\' 3"  (1.905 m)    Wt 129.3 kg    SpO2 100%    BMI 35.62 kg/m  Physical Exam Vitals and nursing note reviewed.  Constitutional:      Appearance: Normal appearance.  HENT:     Head: Normocephalic and atraumatic.  Eyes:     Conjunctiva/sclera: Conjunctivae normal.  Cardiovascular:     Rate and  Rhythm: Normal rate and regular rhythm.  Pulmonary:     Effort: Pulmonary effort is normal. No respiratory distress.     Breath sounds: Normal breath sounds.  Abdominal:     General: There is no distension.     Palpations: Abdomen is soft.     Tenderness: There is no abdominal tenderness.  Skin:    General: Skin is warm and dry.  Neurological:     General: No focal deficit present.     Mental Status: He is alert.    ED Results / Procedures / Treatments   Labs (all labs ordered are listed, but only abnormal results are displayed) Labs Reviewed  COMPREHENSIVE METABOLIC PANEL - Abnormal; Notable for the following components:      Result Value   BUN 25 (*)    Creatinine, Ser 1.29 (*)    Alkaline Phosphatase 35 (*)    All other components within normal limits  RESP PANEL BY RT-PCR (FLU A&B, COVID) ARPGX2  LIPASE, BLOOD  CBC  URINALYSIS, ROUTINE W REFLEX MICROSCOPIC    EKG None  Radiology No results found.  Procedures Procedures    Medications Ordered in ED Medications - No data to display  ED Course/ Medical Decision Making/ A&P                           Medical Decision Making This patient presents to the ED for concern of abdominal pain, this involves an extensive number of treatment options, and is a complaint that carries with it a high risk of complications and morbidity. The emergent differential diagnosis includes, but is not limited to, AA, mesenteric ischemia, appendicitis, diverticulitis, DKA, gastritis, gastroenteritis, AMI, nephrolithiasis, pancreatitis, peritonitis, adrenal insufficiency,lead poisoning, iron toxicity, intestinal ischemia, constipation, UTI,SBO/LBO, splenic rupture, biliary disease, IBD, IBS, PUD, or hepatitis.  Co morbidities that complicate the patient evaluation: GERD, IBS  Physical exam performed. The pertinent findings include: Soft abdomen, nontender and nondistended.  No guarding or rigidity.  No flank pain.  Lab Tests: I Ordered,  and personally interpreted labs.  The pertinent results include: No cytosis, electrolytes within normal limits.  Normal creatinine compared to prior.  No evidence of hematuria or infection on urinalysis.  Normal lipase.  Negative for COVID and flu.   Imaging Studies: As abdomen is soft, patient has no guarding or peritoneal signs with otherwise normal lab work, will defer imaging at this time.   Dispostion: After consideration of the diagnostic results and the patients response to treatment, I feel that patient is not requiring admission or inpatient treatment for his symptoms.  I have very low suspicion for viral or bacterial gastroenteritis, as patient has been afebrile, without vomiting or significant  abdominal pain.  His pain seems mostly intermittent, with correlation to food.  Discussed that his symptoms most likely related to irritable bowel syndrome versus food intolerance.  Discussed symptomatic management of diarrhea with over-the-counter medications.  He does not clinically appear dehydrated.  He is tolerating p.o.  He is hemodynamically stable, and stable for discharge.  Discussed reasons return to the emergency department, patient agreeable to the plan.  Final Clinical Impression(s) / ED Diagnoses Final diagnoses:  Generalized abdominal pain  Irritable bowel syndrome with diarrhea    Rx / DC Orders ED Discharge Orders     None      Portions of this report may have been transcribed using voice recognition software. Every effort was made to ensure accuracy; however, inadvertent computerized transcription errors may be present.    Estill Cotta 02/02/21 1807    Blanchie Dessert, MD 02/02/21 2326

## 2021-02-18 ENCOUNTER — Ambulatory Visit
Admission: RE | Admit: 2021-02-18 | Discharge: 2021-02-18 | Disposition: A | Discharge status: Home / Self Care | Payer: 59 | Source: Ambulatory Visit | Attending: Family Medicine | Admitting: Family Medicine

## 2021-02-18 ENCOUNTER — Other Ambulatory Visit: Payer: Self-pay | Admitting: Family Medicine

## 2021-02-18 DIAGNOSIS — A039 Shigellosis, unspecified: Secondary | ICD-10-CM

## 2021-02-18 DIAGNOSIS — R1084 Generalized abdominal pain: Secondary | ICD-10-CM

## 2021-02-25 ENCOUNTER — Other Ambulatory Visit: Payer: Self-pay | Admitting: Physician Assistant

## 2021-02-25 DIAGNOSIS — R1031 Right lower quadrant pain: Secondary | ICD-10-CM

## 2021-02-25 DIAGNOSIS — N289 Disorder of kidney and ureter, unspecified: Secondary | ICD-10-CM

## 2021-02-28 ENCOUNTER — Ambulatory Visit
Admission: RE | Admit: 2021-02-28 | Discharge: 2021-02-28 | Disposition: A | Discharge status: Home / Self Care | Payer: 59 | Source: Ambulatory Visit | Attending: Physician Assistant | Admitting: Physician Assistant

## 2021-02-28 DIAGNOSIS — R1031 Right lower quadrant pain: Secondary | ICD-10-CM

## 2021-02-28 DIAGNOSIS — N289 Disorder of kidney and ureter, unspecified: Secondary | ICD-10-CM

## 2021-02-28 MED ORDER — IOPAMIDOL (ISOVUE-370) INJECTION 76%
100.0000 mL | Freq: Once | INTRAVENOUS | Status: AC | PRN
  Administered 2021-02-28: 100 mL via INTRAVENOUS

## 2021-08-09 ENCOUNTER — Other Ambulatory Visit: Payer: Self-pay | Admitting: Gastroenterology

## 2022-02-03 ENCOUNTER — Encounter: Payer: Self-pay | Admitting: Physician Assistant

## 2022-02-03 ENCOUNTER — Ambulatory Visit (INDEPENDENT_AMBULATORY_CARE_PROVIDER_SITE_OTHER): Payer: 59 | Admitting: Physician Assistant

## 2022-02-03 ENCOUNTER — Ambulatory Visit (INDEPENDENT_AMBULATORY_CARE_PROVIDER_SITE_OTHER): Payer: 59

## 2022-02-03 DIAGNOSIS — M25561 Pain in right knee: Secondary | ICD-10-CM | POA: Diagnosis not present

## 2022-02-03 DIAGNOSIS — G8929 Other chronic pain: Secondary | ICD-10-CM | POA: Diagnosis not present

## 2022-02-03 DIAGNOSIS — M1711 Unilateral primary osteoarthritis, right knee: Secondary | ICD-10-CM

## 2022-02-03 MED ORDER — LIDOCAINE HCL 1 % IJ SOLN
3.0000 mL | INTRAMUSCULAR | Status: AC | PRN
  Administered 2022-02-03: 3 mL

## 2022-02-03 MED ORDER — METHYLPREDNISOLONE ACETATE 40 MG/ML IJ SUSP
40.0000 mg | INTRAMUSCULAR | Status: AC | PRN
  Administered 2022-02-03: 40 mg via INTRA_ARTICULAR

## 2022-02-03 NOTE — Progress Notes (Signed)
Office Visit Note   Patient: Sean Dickson           Date of Birth: 1965/09/15           MRN: [id number] Visit Date: 02/03/2022              Requested by: Gaynelle Arabian, MD [address] [place],  [place] [address] PCP: Gaynelle Arabian, MD   Assessment & Plan: Visit Diagnoses:  1. Chronic pain of right knee   2. Unilateral primary osteoarthritis, right knee     Plan:  Will see how he does with the cortisone injection.  He will ice the knee this evening and avoid any high impact activities at least until Wednesday.  If he still having pain in the knee in 2 weeks he will call the office and we will obtain an MRI to rule out internal derangement.  Otherwise he will follow-up with [place] as needed. At least 3 months between injections.  Follow-Up Instructions: Return if symptoms worsen or fail to improve.   Orders:  Orders Placed This Encounter  Procedures   Large Joint Inj: R knee   XR Knee 1-2 Views Right   No orders of the defined types were placed in this encounter.     Procedures: Large Joint Inj: R knee on 02/03/2022 4:14 PM Indications: pain Details: 22 G 1.5 in needle, anterolateral approach  Arthrogram: No  Medications: 3 mL lidocaine 1 %; 40 mg methylPREDNISolone acetate 40 MG/ML Outcome: tolerated well, no immediate complications Procedure, treatment alternatives, risks and benefits explained, specific risks discussed. Consent was given by the patient. Immediately prior to procedure a time out was called to verify the correct patient, procedure, equipment, support staff and site/side marked as required. Patient was prepped and draped in the usual sterile fashion.       Clinical Data: No additional findings.   Subjective: Chief Complaint  Patient presents with   Right Knee - Pain    HPI Patient is 57 year old male who we have not seen in a number of years.  Comes in today with right knee pain.  He denies any mechanical symptoms.  Does state  that the knee feels better if he wears a brace.  He has a pulling sensation back in the knee whenever he is taking his shoes off.  He notes some weakness when getting up off the floor in regards to the knee.  Has had some increased reflux lately and is mainly been taking Tylenol for the knee pain.  He does note one instance where something popped in the knee whenever he rolled over in bed.  Denies any swelling in the knee.  Patient is nondiabetic  Review of Systems Negative for fevers or chills.  Objective: Vital Signs: There were no vitals taken for this visit.  Physical Exam Constitutional:      Appearance: He is not ill-appearing or diaphoretic.  Pulmonary:     Effort: Pulmonary effort is normal.  Neurological:     Mental Status: He is alert.  Psychiatric:        Mood and Affect: Mood normal.     Ortho Exam Bilateral knees good range of motion both knees no instability valgus varus stressing of either knee.  Tenderness right knee along medial joint line.  No abnormal warmth erythema or effusion of either knee.  Right knee negative for McMurray's.  Specialty Comments:  No specialty comments available.  Imaging: XR Knee 1-2 Views Right  Result Date:  02/03/2022 Left knee 2 views: No acute fracture.  Knee is well located.  Moderate narrowing the medial joint line.  Otherwise knee is well-preserved.    PMFS History: Patient Active Problem List   Diagnosis Date Noted   Hyperlipidemia 09/16/2014   GERD (gastroesophageal reflux disease) 09/16/2014   Schatzki's ring 09/16/2014   Atypical chest pain 09/16/2014   Mild obesity 09/16/2014   Past Medical History:  Diagnosis Date   Allergic rhinitis    Anxiety attack    Anxiety state, unspecified    with attacks   Atypical chest pain    Esophageal spasm    GERD (gastroesophageal reflux disease)    schatzki ring   H/O adenomatous polyp of colon    History of kidney stones    History of panic attacks    Hypercholesteremia     Hypothyroidism    IBS (irritable bowel syndrome)    Kidney stone    Neuromuscular disorder ([place]) 2017   tingling in hands and toes bilat from pinch nerve   PONV (postoperative nausea and vomiting)    Schatzki's ring     Family History  Problem Relation Age of Onset   Other Mother        palpitations   Macular degeneration Mother    Non-Hodgkin's lymphoma Father    Uterine cancer Sister    Colon polyps Sister 10       4 adenoma    Past Surgical History:  Procedure Laterality Date   anterior cervical repair     INGUINAL HERNIA REPAIR Right 2005   TONSILLECTOMY     UMBILICAL HERNIA REPAIR N/A 01/06/2019   Procedure: OPEN UMBILICAL HERNIA REPAIR WITH MESH;  Surgeon: Ileana Roup, MD;  Location: [facility];  Service: General;  Laterality: N/A;   WISDOM TOOTH EXTRACTION     Social History   Occupational History   Occupation: lineman    Employer: [employer]  Tobacco Use   Smoking status: Never   Smokeless tobacco: Never  Vaping Use   Vaping Use: Never used  Substance and Sexual Activity   Alcohol use: No    Alcohol/week: 0.0 standard drinks of alcohol   Drug use: No   Sexual activity: Not on file

## 2022-03-07 ENCOUNTER — Other Ambulatory Visit: Payer: Self-pay

## 2022-03-07 ENCOUNTER — Telehealth: Payer: Self-pay | Admitting: Orthopaedic Surgery

## 2022-03-07 DIAGNOSIS — G8929 Other chronic pain: Secondary | ICD-10-CM

## 2022-03-07 NOTE — Telephone Encounter (Signed)
Patient states he wants Mri due to no relief with injection

## 2022-03-07 NOTE — Telephone Encounter (Signed)
Mri ordered, called and advised pt

## 2022-03-25 ENCOUNTER — Ambulatory Visit
Admission: RE | Admit: 2022-03-25 | Discharge: 2022-03-25 | Disposition: A | Discharge status: Home / Self Care | Payer: 59 | Source: Ambulatory Visit | Attending: Physician Assistant | Admitting: Physician Assistant

## 2022-03-25 DIAGNOSIS — G8929 Other chronic pain: Secondary | ICD-10-CM

## 2022-03-27 ENCOUNTER — Ambulatory Visit (INDEPENDENT_AMBULATORY_CARE_PROVIDER_SITE_OTHER): Payer: 59 | Admitting: Physician Assistant

## 2022-03-27 ENCOUNTER — Encounter: Payer: Self-pay | Admitting: Physician Assistant

## 2022-03-27 DIAGNOSIS — M1711 Unilateral primary osteoarthritis, right knee: Secondary | ICD-10-CM

## 2022-03-27 NOTE — Progress Notes (Signed)
HPI: Sean Dickson returns today to go over the MRI of his right knee.  He states that the cortisone injection only gave him very temporary relief.  He states some days are worse than others in regards to his right knee and still having pain in the back of the knee.  He takes ibuprofen and Tylenol.  He continues to wear knee brace.  He has had no new injury to the knee.  Denies any real mechanical symptoms of the knee. MRI of the knee dated 03/27/2022 is reviewed with the patient images shown.  MRI shows tricompartmental arthritic changes with focal high-grade arthritic changes central inferior trochlea.  Arthritic changes involving the posterior weightbearing surface of the femoral condyle and the medial tibial plateau.  Lateral compartment there is chondral a defect measuring 4.4 mm along the posterior wall of the weightbearing surface.  Medial compartment nondisplaced oblique vertical tear medial meniscal body with blunting of the free edge.  Degenerative changes involving the lateral compartment no frank tear.  Small Baker's cyst.   Physical exam: General well-developed well-nourished male no acute distress ambulates with a slight antalgic gait.  Good range of motion overall the right knee.  Impression: Right knee tricompartmental arthritic changes  Plan: Given his MRI and the fact he has no mechanical symptoms no edema of the knee and is failed conservative treatment which is included bracing, shots time medications.  Recommend right knee viscosupplementation.  Will try to gain approval for this and have him back once this is available.  He has no planned upcoming surgery on the right knee in the next 6 months.

## 2022-03-28 ENCOUNTER — Telehealth: Payer: Self-pay

## 2022-03-28 NOTE — Telephone Encounter (Signed)
VOB submitted for Durolane, right knee.  

## 2022-03-28 NOTE — Telephone Encounter (Signed)
Please get auth for right knee gel inj

## 2022-05-05 ENCOUNTER — Telehealth: Payer: Self-pay | Admitting: Orthopaedic Surgery

## 2022-05-05 ENCOUNTER — Other Ambulatory Visit: Payer: Self-pay

## 2022-05-05 DIAGNOSIS — M1711 Unilateral primary osteoarthritis, right knee: Secondary | ICD-10-CM

## 2022-05-05 NOTE — Telephone Encounter (Signed)
Patient asking about his gel injection for his knee states he had called a month ago and has not gotten a call back

## 2022-05-05 NOTE — Telephone Encounter (Signed)
Talked with patient and appointment has been scheduled.  

## 2022-05-08 ENCOUNTER — Encounter: Payer: Self-pay | Admitting: Physician Assistant

## 2022-05-08 ENCOUNTER — Ambulatory Visit (INDEPENDENT_AMBULATORY_CARE_PROVIDER_SITE_OTHER): Payer: 59 | Admitting: Physician Assistant

## 2022-05-08 DIAGNOSIS — M1711 Unilateral primary osteoarthritis, right knee: Secondary | ICD-10-CM | POA: Diagnosis not present

## 2022-05-08 MED ORDER — LIDOCAINE HCL 1 % IJ SOLN
3.0000 mL | INTRAMUSCULAR | Status: AC | PRN
  Administered 2022-05-08: 3 mL

## 2022-05-08 MED ORDER — SODIUM HYALURONATE 60 MG/3ML IX PRSY
60.0000 mg | PREFILLED_SYRINGE | INTRA_ARTICULAR | Status: AC | PRN
  Administered 2022-05-08: 60 mg via INTRA_ARTICULAR

## 2022-05-08 NOTE — Progress Notes (Signed)
   Procedure Note  Patient: Sean Dickson             Date of Birth: 10-06-1965           MRN: [id number]             Visit Date: 05/08/2022 HPI: Mr. Rudenko comes in today for scheduled Durolane injection right knee.  He has had no known injury to the knee.  He has known tricompartmental arthritis of the right knee by MRI.  Tried other forms of treatment including cortisone injections and medications without any real relief.  He is also tried knee braces.  The has no scheduled surgery in the next 6 months for the right knee.  Physical exam: Right knee good range of motion.  No abnormal warmth erythema.  No effusion right knee   Procedures: Visit Diagnoses:  1. Primary osteoarthritis of right knee     Large Joint Inj on 05/08/2022 2:53 PM Indications: pain Details: 22 G 1.5 in needle, superolateral approach  Arthrogram: No  Medications: 60 mg Sodium Hyaluronate 60 MG/3ML; 3 mL lidocaine 1 % Outcome: tolerated well, no immediate complications Procedure, treatment alternatives, risks and benefits explained, specific risks discussed. Consent was given by the patient. Immediately prior to procedure a time out was called to verify the correct patient, procedure, equipment, support staff and site/side marked as required. Patient was prepped and draped in the usual sterile fashion.     Plan: He will follow-up with [place] as needed.  He understands wait 6 months between supplemental injections.  Questions were encouraged and answered

## 2022-08-25 ENCOUNTER — Ambulatory Visit: Payer: 59 | Admitting: Physician Assistant

## 2022-09-01 ENCOUNTER — Ambulatory Visit: Payer: 59 | Admitting: Physician Assistant

## 2022-09-01 ENCOUNTER — Telehealth: Payer: Self-pay | Admitting: Radiology

## 2022-09-01 ENCOUNTER — Encounter: Payer: Self-pay | Admitting: Physician Assistant

## 2022-09-01 VITALS — Ht 76.0 in | Wt 280.0 lb

## 2022-09-01 DIAGNOSIS — M1711 Unilateral primary osteoarthritis, right knee: Secondary | ICD-10-CM | POA: Diagnosis not present

## 2022-09-01 MED ORDER — LIDOCAINE HCL 1 % IJ SOLN
3.0000 mL | INTRAMUSCULAR | Status: AC | PRN
  Administered 2022-09-01: 3 mL

## 2022-09-01 MED ORDER — METHYLPREDNISOLONE ACETATE 40 MG/ML IJ SUSP
40.0000 mg | INTRAMUSCULAR | Status: AC | PRN
  Administered 2022-09-01: 40 mg via INTRA_ARTICULAR

## 2022-09-01 NOTE — Progress Notes (Signed)
HPI: [name] comes in today for right knee pain.  States pain comes and goes.  He feels like his knee overall is getting weaker.  However he feels the pain in the knee is less frequent than it was prior to receiving Durolane injection on 05/08/2022.  States that the pain is not consistent.  He has trouble going up and down stairs and squatting.  He denies any radicular symptoms down the leg.  Review of systems: See HPI otherwise negative or noncontributory.  Physical exam: General well-developed well-nourished male no acute distress mood and affect appropriate.  Psych alert and oriented x 3.  Lower extremities 5 out of 5 strength throughout.   Bilateral knees no abnormal warmth erythema or effusion.  Crepitus with passive range of motion bilateral knees.  No instability valgus varus stressing.  Calves are supple and nontender.  Good range of motion of bilateral hips without pain. Lumbar spine: Near full flexion coming within an inch of touching his toes.  Full extension without pain.Negative straight leg raise bilaterally.  Tight hamstrings bilaterally.    Impression: Right knee osteoarthritis  Plan: Will place cortisone injection in his right knee today to help with the pain.  Discussed with him at length quad strengthening exercises.  He will be mindful of any radicular symptoms down either leg or low back pain.  Questions were encouraged and answered at length.  Tolerated cortisone injection well today.  Will work on setting him up for supplemental injection sometime in October.      Procedure Note  Patient: Sean Dickson             Date of Birth: 31-Jan-1965           MRN: [id number]             Visit Date: 09/01/2022  Procedures: Visit Diagnoses:  1. Primary osteoarthritis of right knee     Large Joint Inj: R knee on 09/01/2022 4:32 PM Indications: pain Details: 22 G 1.5 in needle, anterolateral approach  Arthrogram: No  Medications: 3 mL lidocaine 1 %; 40 mg methylPREDNISolone  acetate 40 MG/ML Outcome: tolerated well, no immediate complications Procedure, treatment alternatives, risks and benefits explained, specific risks discussed. Consent was given by the patient. Immediately prior to procedure a time out was called to verify the correct patient, procedure, equipment, support staff and site/side marked as required. Patient was prepped and draped in the usual sterile fashion.

## 2022-09-01 NOTE — Telephone Encounter (Signed)
Please submit for gel injection right knee.  Last injection-Durolane 05/08/2022. Patient is aware it will be October before it can be repeated.  Sean Dickson

## 2022-09-02 NOTE — Telephone Encounter (Signed)
Noted.  Next available gel injection would need to be after 11/07/2022. Will submit in September, 2024 for Durolane, right knee

## 2022-09-04 ENCOUNTER — Ambulatory Visit: Payer: 59 | Admitting: Podiatry

## 2022-09-10 ENCOUNTER — Telehealth: Payer: Self-pay | Admitting: Orthopaedic Surgery

## 2022-09-10 ENCOUNTER — Other Ambulatory Visit: Payer: Self-pay | Admitting: Orthopaedic Surgery

## 2022-09-10 MED ORDER — METHYLPREDNISOLONE 4 MG PO TABS: ORAL_TABLET | ORAL | 0 refills | Status: AC

## 2022-09-10 NOTE — Telephone Encounter (Signed)
LMOM for patient of the below message from [place]  

## 2022-09-10 NOTE — Telephone Encounter (Signed)
Patient called and said that he has pain numbness going down his legs (both). He was told t call if he those problems.CB#[phone]

## 2022-09-23 ENCOUNTER — Ambulatory Visit (INDEPENDENT_AMBULATORY_CARE_PROVIDER_SITE_OTHER): Payer: 59 | Admitting: Podiatry

## 2022-09-23 ENCOUNTER — Encounter: Payer: Self-pay | Admitting: Podiatry

## 2022-09-23 ENCOUNTER — Ambulatory Visit (INDEPENDENT_AMBULATORY_CARE_PROVIDER_SITE_OTHER): Payer: 59

## 2022-09-23 DIAGNOSIS — M722 Plantar fascial fibromatosis: Secondary | ICD-10-CM

## 2022-09-23 MED ORDER — TRIAMCINOLONE ACETONIDE 40 MG/ML IJ SUSP
20.0000 mg | Freq: Once | INTRAMUSCULAR | Status: AC
  Administered 2022-09-23: 20 mg

## 2022-09-23 NOTE — Progress Notes (Signed)
Subjective:  Patient ID: Sean Dickson, male    DOB: 1965-03-03,  MRN: [id number] HPI Chief Complaint  Patient presents with   Foot Pain    Plantar heel left - intermittent pain x few months   New Patient (Initial Visit)    Est pt 2019    57 y.o. male presents with the above complaint.   ROS: Denies fever chills nausea vomiting muscle aches pains calf pain back pain chest pain shortness of breath.  Past Medical History:  Diagnosis Date   Allergic rhinitis    Anxiety attack    Anxiety state, unspecified    with attacks   Atypical chest pain    Esophageal spasm    GERD (gastroesophageal reflux disease)    schatzki ring   H/O adenomatous polyp of colon    History of kidney stones    History of panic attacks    Hypercholesteremia    Hypothyroidism    IBS (irritable bowel syndrome)    Kidney stone    Neuromuscular disorder ([facility]) 2017   tingling in hands and toes bilat from pinch nerve   PONV (postoperative nausea and vomiting)    Schatzki's ring    Past Surgical History:  Procedure Laterality Date   anterior cervical repair     INGUINAL HERNIA REPAIR Right 2005   TONSILLECTOMY     UMBILICAL HERNIA REPAIR N/A 01/06/2019   Procedure: OPEN UMBILICAL HERNIA REPAIR WITH MESH;  Surgeon: Andria Meuse, MD;  Location: [facility];  Service: General;  Laterality: N/A;   WISDOM TOOTH EXTRACTION      Current Outpatient Medications:    acetaminophen (TYLENOL) 500 MG tablet, Take 500-1,000 mg by mouth every 6 (six) hours as needed (for pain.)., Disp: , Rfl:    aspirin EC 81 MG tablet, Take 81 mg by mouth daily., Disp: , Rfl:    fenofibrate 160 MG tablet, Take 160 mg by mouth daily. , Disp: , Rfl:    fluticasone (FLONASE) 50 MCG/ACT nasal spray, Place 1 spray into the nose daily as needed for allergies. , Disp: , Rfl:    levothyroxine (SYNTHROID, LEVOTHROID) 100 MCG tablet, Take 100 mcg by mouth daily before breakfast. , Disp: , Rfl: 1   methylPREDNISolone (MEDROL) 4 MG tablet,  Medrol dose pack. Take as instructed, Disp: 21 tablet, Rfl: 0   Multiple Vitamin (MULTIVITAMIN WITH MINERALS) TABS tablet, Take 1 tablet by mouth daily., Disp: , Rfl:    omeprazole (PRILOSEC) 40 MG capsule, Take 40 mg by mouth daily. , Disp: , Rfl: 3   Polyethyl Glycol-Propyl Glycol (SYSTANE) 0.4-0.3 % SOLN, Place 1 drop into both eyes daily., Disp: , Rfl:    Turmeric 500 MG CAPS, Take 500 mg by mouth daily., Disp: , Rfl:   Allergies  Allergen Reactions   Red Yeast Rice Extract     Other Reaction(s): GERD   Atorvastatin Other (See Comments)    GERD, Myalgias   Codeine     Stomach upset    Pravastatin Other (See Comments)    Myalgias   Red Yeast Rice [Cholestin] Other (See Comments)    GERD    Review of Systems Objective:  There were no vitals filed for this visit.  General: Well developed, nourished, in no acute distress, alert and oriented x3   Dermatological: Skin is warm, dry and supple bilateral. Nails x 10 are well maintained; remaining integument appears unremarkable at this time. There are no open sores, no preulcerative lesions, no rash or signs of infection  present.  Vascular: Dorsalis Pedis artery and Posterior Tibial artery pedal pulses are 2/4 bilateral with immedate capillary fill time. Pedal hair growth present. No varicosities and no lower extremity edema present bilateral.   Neruologic: Grossly intact via light touch bilateral. Vibratory intact via tuning fork bilateral. Protective threshold with Semmes Wienstein monofilament intact to all pedal sites bilateral. Patellar and Achilles deep tendon reflexes 2+ bilateral. No Babinski or clonus noted bilateral.   Musculoskeletal: No gross boney pedal deformities bilateral. No pain, crepitus, or limitation noted with foot and ankle range of motion bilateral. Muscular strength 5/5 in all groups tested bilateral.  He has no tenderness on medial lateral compression of the calcaneus but he does have pain with direct palpation  of the medial calcaneal tubercle.  Gait: Unassisted, Nonantalgic.    Radiographs:  Radiographs taken today demonstrate osseously mature individual plantar distally oriented calcaneal heel spur soft tissue increase in density at the plantar fascia calcaneal insertion site.  Soft tissue increase in density at this site is consistent with plantar fasciitis.  There is slight thickening in the Achilles area as well.    Assessment & Plan:   Assessment: Planter fasciitis.  Plan: 20 mg of Kenalog was injected to the left heel today at the point of maximal tenderness.  Follow-up with him in 1 month if necessary     Makensey Rego T. [place], [place]

## 2022-11-04 ENCOUNTER — Telehealth: Payer: Self-pay

## 2022-11-04 NOTE — Telephone Encounter (Signed)
VOB submitted for Durolane, right knee.

## 2023-01-26 ENCOUNTER — Ambulatory Visit: Payer: 59 | Admitting: Physician Assistant

## 2023-01-26 ENCOUNTER — Other Ambulatory Visit (INDEPENDENT_AMBULATORY_CARE_PROVIDER_SITE_OTHER): Payer: 59

## 2023-01-26 DIAGNOSIS — M25562 Pain in left knee: Secondary | ICD-10-CM

## 2023-01-26 DIAGNOSIS — M1711 Unilateral primary osteoarthritis, right knee: Secondary | ICD-10-CM

## 2023-01-26 DIAGNOSIS — M1712 Unilateral primary osteoarthritis, left knee: Secondary | ICD-10-CM

## 2023-01-26 DIAGNOSIS — R29898 Other symptoms and signs involving the musculoskeletal system: Secondary | ICD-10-CM | POA: Diagnosis not present

## 2023-01-26 MED ORDER — LIDOCAINE HCL 1 % IJ SOLN
3.0000 mL | INTRAMUSCULAR | Status: AC | PRN
  Administered 2023-01-26: 3 mL

## 2023-01-26 MED ORDER — SODIUM HYALURONATE 60 MG/3ML IX PRSY
60.0000 mg | PREFILLED_SYRINGE | INTRA_ARTICULAR | Status: AC | PRN
  Administered 2023-01-26: 60 mg via INTRA_ARTICULAR

## 2023-01-26 MED ORDER — METHYLPREDNISOLONE ACETATE 40 MG/ML IJ SUSP
40.0000 mg | INTRAMUSCULAR | Status: AC | PRN
  Administered 2023-01-26: 40 mg via INTRA_ARTICULAR

## 2023-01-26 NOTE — Progress Notes (Signed)
Office Visit Note   Patient: Sean Dickson           Date of Birth: 1965-12-15           MRN: [id number] Visit Date: 01/26/2023              Requested by: No referring provider defined for this encounter. PCP: Blair Heys, MD (Inactive)   Assessment & Plan: Visit Diagnoses:  1. Acute pain of left knee   2. Bilateral leg weakness     Plan:  Will have him continue to work with his trainer on core strengthening and lower extremity strengthening.  He continues to have radicular symptoms down the legs or if his symptoms worsen in any way of contact our office and we can obtain an MRI rule out HNP as the source of his radicular symptoms.  He understands to wait 3 months between cortisone injections and 6 months between viscosupplementation injections.  Questions were encouraged and answered at length today.  Follow-Up Instructions: Return if symptoms worsen or fail to improve.   Orders:  Orders Placed This Encounter  Procedures   Large Joint Inj   Large Joint Inj   XR Lumbar Spine 2-3 Views   XR Knee 1-2 Views Left   No orders of the defined types were placed in this encounter.     Procedures: Large Joint Inj on 01/26/2023 5:50 PM Indications: pain Details: 22 G 1.5 in needle, anterolateral approach  Arthrogram: No  Medications: 60 mg Sodium Hyaluronate 60 MG/3ML Outcome: tolerated well, no immediate complications Procedure, treatment alternatives, risks and benefits explained, specific risks discussed. Consent was given by the patient. Immediately prior to procedure a time out was called to verify the correct patient, procedure, equipment, support staff and site/side marked as required. Patient was prepped and draped in the usual sterile fashion.    Large Joint Inj: L knee on 01/26/2023 6:05 PM Indications: pain Details: 22 G 1.5 in needle, anterolateral approach  Arthrogram: No  Medications: 3 mL lidocaine 1 %; 40 mg methylPREDNISolone acetate 40 MG/ML Outcome:  tolerated well, no immediate complications Procedure, treatment alternatives, risks and benefits explained, specific risks discussed. Consent was given by the patient. Immediately prior to procedure a time out was called to verify the correct patient, procedure, equipment, support staff and site/side marked as required. Patient was prepped and draped in the usual sterile fashion.       Clinical Data: No additional findings.   Subjective: Chief Complaint  Patient presents with   Left Knee - Pain   Right Leg - Weakness   Left Leg - Weakness    HPI [name] comes in today due to bilateral knee pain.  Last cortisone injection 09/01/2022 right knee gave him some relief.  He is also having left knee pain points to the tibial tubercle region and source of his pain especially whenever he goes to sit.  He still having some radicular symptoms down the back of his legs into his feet at times.  Denies any waking pain bowel or bladder dysfunction, weight change, saddle anesthesia or fevers or chills.  Notes he just is weak with getting up and down.  Denies any back pain whatsoever.  He has been working with physical trainer for the last 4 weeks on core and lower leg strengthening and feels this has been beneficial. He is scheduled for right knee Durolane injection.  He is tried cortisone injections and medications in the past along with strengthening.  He  has no upcoming surgery on the right knee 6 months.  He has known osteoarthritis of right knee. Review of Systems See HPI otherwise negative  Objective: Vital Signs: There were no vitals taken for this visit.  Physical Exam Constitutional:      Appearance: He is not ill-appearing or diaphoretic.  Neurological:     Mental Status: He is alert.   Vascular: Calves supple nontender bilaterally.  Dorsal pedal pulses are 2+ and equal symmetric.  Ortho Exam Bilateral knees: No abnormal warmth erythema or effusion.  Good range of motion both knees.   No instability valgus varus stress in either knee. Lumbar spine: Negative straight leg raise.  5 out of 5 strength throughout the lower extremities.  Tight hamstrings bilaterally.  Sensation grossly intact bilateral feet to light touch. Hips: Good range of motion both hips without pain.  No tenderness over the trochanteric region of either hip.  Specialty Comments:  No specialty comments available.  Imaging: XR Knee 1-2 Views Left Result Date: 01/26/2023 Left knee 2 views: Knee is well located.  Moderate narrowing medial joint line.  Lateral joint line well-preserved.  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  No acute fractures or acute findings.  XR Lumbar Spine 2-3 Views Result Date: 01/26/2023 Lumbar spine 2 views: Slight loss of lordotic curvature.  Anterior vertebral osteophytes throughout.  Lower lumbar facet joint arthritic changes.  No spondylolisthesis.  No acute fractures.    PMFS History: Patient Active Problem List   Diagnosis Date Noted   Hyperlipidemia 09/16/2014   GERD (gastroesophageal reflux disease) 09/16/2014   Schatzki's ring 09/16/2014   Atypical chest pain 09/16/2014   Mild obesity 09/16/2014   Past Medical History:  Diagnosis Date   Allergic rhinitis    Anxiety attack    Anxiety state, unspecified    with attacks   Atypical chest pain    Esophageal spasm    GERD (gastroesophageal reflux disease)    schatzki ring   H/O adenomatous polyp of colon    History of kidney stones    History of panic attacks    Hypercholesteremia    Hypothyroidism    IBS (irritable bowel syndrome)    Kidney stone    Neuromuscular disorder (HCC) 2017   tingling in hands and toes bilat from pinch nerve   PONV (postoperative nausea and vomiting)    Schatzki's ring     Family History  Problem Relation Age of Onset   Other Mother        palpitations   Macular degeneration Mother    Non-Hodgkin's lymphoma Father    Uterine cancer  Sister    Colon polyps Sister 35       4 adenoma    Past Surgical History:  Procedure Laterality Date   anterior cervical repair     INGUINAL HERNIA REPAIR Right 2005   TONSILLECTOMY     UMBILICAL HERNIA REPAIR N/A 01/06/2019   Procedure: OPEN UMBILICAL HERNIA REPAIR WITH MESH;  Surgeon: Andria Meuse, MD;  Location: [facility];  Service: General;  Laterality: N/A;   WISDOM TOOTH EXTRACTION     Social History   Occupational History   Occupation: lineman    Employer: [employer]  Tobacco Use   Smoking status: Never   Smokeless tobacco: Never  Vaping Use   Vaping status: Never Used  Substance and Sexual Activity   Alcohol use: No    Alcohol/week: 0.0 standard drinks of alcohol   Drug use: No  Sexual activity: Not on file

## 2023-07-16 ENCOUNTER — Telehealth: Payer: Self-pay | Admitting: Radiology

## 2023-07-16 NOTE — Telephone Encounter (Signed)
 Patient is on schedule for Monday for gel injection in right knee. Last injection was 01/26/23.  Can we submit for injection or does patient need updated visit first?

## 2023-07-16 NOTE — Telephone Encounter (Signed)
 VOB submitted for Durolane, right knee  Should be okay to have injection.

## 2023-07-20 ENCOUNTER — Ambulatory Visit (INDEPENDENT_AMBULATORY_CARE_PROVIDER_SITE_OTHER): Admitting: Physician Assistant

## 2023-07-20 ENCOUNTER — Encounter: Payer: Self-pay | Admitting: Physician Assistant

## 2023-07-20 ENCOUNTER — Other Ambulatory Visit: Payer: Self-pay

## 2023-07-20 DIAGNOSIS — M1711 Unilateral primary osteoarthritis, right knee: Secondary | ICD-10-CM

## 2023-07-20 MED ORDER — SODIUM HYALURONATE 60 MG/3ML IX PRSY
60.0000 mg | PREFILLED_SYRINGE | INTRA_ARTICULAR | Status: AC | PRN
  Administered 2023-07-20: 60 mg via INTRA_ARTICULAR

## 2023-07-20 NOTE — Progress Notes (Signed)
   Procedure Note  Patient: Sean Dickson             Date of Birth: 12-Oct-1965           MRN: [id number]             Visit Date: 07/20/2023 HPI: Sean Dickson comes in today for right knee Durolane injection.  Again he has known osteoarthritis in the knee.  He has tried conservative treatments including cortisone injections without significant relief.  No scheduled surgery on the right knee neck 6 months.  Has had prior viscosupplementation injections which gave him good results.  Last viscosupplementation injection was on 01/26/2023 which worked until about 2 weeks ago.  He states he was playing golf at the beach.  Knee became sore mostly medial aspect.  Some giving way like sensation in the knee at times.  He is working with a Systems analyst.  No new injuries.  Physical exam: Right knee no abnormal warmth erythema or effusion.  Patellofemoral crepitus.  Good range of motion Procedures: Visit Diagnoses:  1. Primary osteoarthritis of right knee     Large Joint Inj: R knee on 07/20/2023 4:30 PM Indications: pain Details: 22 G 1.5 in needle, anterolateral approach  Arthrogram: No  Medications: 60 mg Sodium Hyaluronate 60 MG/3ML Outcome: tolerated well, no immediate complications Procedure, treatment alternatives, risks and benefits explained, specific risks discussed. Consent was given by the patient. Immediately prior to procedure a time out was called to verify the correct patient, procedure, equipment, support staff and site/side marked as required. Patient was prepped and draped in the usual sterile fashion.     Plan: He will follow-up with us  as needed he knows to wait at least 6 months between viscosupplementation injections.  Questions encouraged and answered at length.

## 2023-10-16 ENCOUNTER — Other Ambulatory Visit: Payer: Self-pay

## 2023-10-16 ENCOUNTER — Encounter (HOSPITAL_BASED_OUTPATIENT_CLINIC_OR_DEPARTMENT_OTHER): Payer: Self-pay | Admitting: Emergency Medicine

## 2023-10-16 ENCOUNTER — Emergency Department (HOSPITAL_BASED_OUTPATIENT_CLINIC_OR_DEPARTMENT_OTHER)

## 2023-10-16 ENCOUNTER — Emergency Department (HOSPITAL_BASED_OUTPATIENT_CLINIC_OR_DEPARTMENT_OTHER)
Admission: EM | Admit: 2023-10-16 | Discharge: 2023-10-16 | Disposition: A | Discharge status: Home / Self Care | Source: Ambulatory Visit | Attending: Emergency Medicine | Admitting: Emergency Medicine

## 2023-10-16 ENCOUNTER — Emergency Department (HOSPITAL_BASED_OUTPATIENT_CLINIC_OR_DEPARTMENT_OTHER): Admitting: Radiology

## 2023-10-16 DIAGNOSIS — R519 Headache, unspecified: Secondary | ICD-10-CM | POA: Diagnosis present

## 2023-10-16 DIAGNOSIS — R109 Unspecified abdominal pain: Secondary | ICD-10-CM | POA: Diagnosis not present

## 2023-10-16 DIAGNOSIS — Z7982 Long term (current) use of aspirin: Secondary | ICD-10-CM | POA: Insufficient documentation

## 2023-10-16 DIAGNOSIS — E039 Hypothyroidism, unspecified: Secondary | ICD-10-CM | POA: Diagnosis not present

## 2023-10-16 DIAGNOSIS — M79602 Pain in left arm: Secondary | ICD-10-CM | POA: Diagnosis not present

## 2023-10-16 DIAGNOSIS — R5383 Other fatigue: Secondary | ICD-10-CM | POA: Insufficient documentation

## 2023-10-16 DIAGNOSIS — M26622 Arthralgia of left temporomandibular joint: Secondary | ICD-10-CM | POA: Diagnosis not present

## 2023-10-16 DIAGNOSIS — Z79899 Other long term (current) drug therapy: Secondary | ICD-10-CM | POA: Diagnosis not present

## 2023-10-16 DIAGNOSIS — R9431 Abnormal electrocardiogram [ECG] [EKG]: Secondary | ICD-10-CM | POA: Diagnosis not present

## 2023-10-16 HISTORY — DX: Hyperlipidemia, unspecified: E78.5

## 2023-10-16 LAB — CBC
HCT: 43.8 % (ref 39.0–52.0)
Hemoglobin: 14.6 g/dL (ref 13.0–17.0)
MCH: 31 pg (ref 26.0–34.0)
MCHC: 33.3 g/dL (ref 30.0–36.0)
MCV: 93 fL (ref 80.0–100.0)
Platelets: 280 K/uL (ref 150–400)
RBC: 4.71 MIL/uL (ref 4.22–5.81)
RDW: 12.4 % (ref 11.5–15.5)
WBC: 4.9 K/uL (ref 4.0–10.5)
nRBC: 0 % (ref 0.0–0.2)

## 2023-10-16 LAB — RESP PANEL BY RT-PCR (RSV, FLU A&B, COVID)  RVPGX2
Influenza A by PCR: NEGATIVE
Influenza B by PCR: NEGATIVE
Resp Syncytial Virus by PCR: NEGATIVE
SARS Coronavirus 2 by RT PCR: NEGATIVE

## 2023-10-16 LAB — TSH: TSH: 1.7 u[IU]/mL (ref 0.350–4.500)

## 2023-10-16 LAB — TROPONIN T, HIGH SENSITIVITY
Troponin T High Sensitivity: <15 ng/L (ref 0–19)
Troponin T High Sensitivity: <15 ng/L (ref 0–19)

## 2023-10-16 LAB — BASIC METABOLIC PANEL WITH GFR
Anion gap: 15 (ref 5–15)
BUN: 26 mg/dL — ABNORMAL HIGH (ref 6–20)
CO2: 25 mmol/L (ref 22–32)
Calcium: 10.3 mg/dL (ref 8.9–10.3)
Chloride: 103 mmol/L (ref 98–111)
Creatinine, Ser: 1.29 mg/dL — ABNORMAL HIGH (ref 0.61–1.24)
GFR, Estimated: >60 mL/min (ref >60)
Glucose, Bld: 129 mg/dL — ABNORMAL HIGH (ref 70–99)
Potassium: 4.5 mmol/L (ref 3.5–5.1)
Sodium: 143 mmol/L (ref 135–145)

## 2023-10-16 MED ORDER — ACETAMINOPHEN 500 MG PO TABS
1000.0000 mg | ORAL_TABLET | Freq: Once | ORAL | Status: AC
  Administered 2023-10-16: 1000 mg via ORAL
  Filled 2023-10-16: qty 2

## 2023-10-16 MED ORDER — IOHEXOL 350 MG/ML SOLN
75.0000 mL | Freq: Once | INTRAVENOUS | Status: AC | PRN
  Administered 2023-10-16: 75 mL via INTRAVENOUS

## 2023-10-16 NOTE — Discharge Instructions (Signed)
 As discussed, your heart enzymes were normal.  Your EKG did have some slight abnormality.  Will recommend follow-up with cardiology.  Will send a referral for this.  Regarding headache, suspect that there could be component of left-sided TMJ.  Recommend getting a mouthguard over-the-counter.  Continue take Tylenol , ibuprofen  for your pain.  Recommend follow-up primary care otherwise.

## 2023-10-16 NOTE — ED Provider Notes (Signed)
 [place] EMERGENCY DEPARTMENT AT [facility] Provider Note   CSN: [id number] Arrival date & time: 10/16/23  1241     Patient presents with: Arm Pain and Abnormal ECG   Sean Dickson is a 58 y.o. male.    Arm Pain   58 year old male presents emergency department with complaints of headache, abdominal pain, generalized fatigue.  States he has not been feeling well for the past week or 2.  States that he has been getting left-sided headache mainly in the crown of his head with some radiation to his left jaw daily for the past week.  States it typically does worsen when he wakes up and improves as the day goes on.  States that he woke up with some left arm pain this morning associated with the headache prompting visit to the urgent care.  Had an EKG at that time was told to be abnormal was told to come the emergency department.  Patient denies any chest pain, shortness of breath.  Denies any fevers, chills, cough, abdominal pain, nausea, vomiting.  Past medical history significant for neuromuscular disorder, GERD, hyperlipidemia, hypothyroidism, IBS, hypercholesterolemia, schatzki's ring  Prior to Admission medications   Medication Sig Start Date End Date Taking? Authorizing Provider  acetaminophen  (TYLENOL ) 500 MG tablet Take 500-1,000 mg by mouth every 6 (six) hours as needed (for pain.).    [provider]  aspirin  EC 81 MG tablet Take 81 mg by mouth daily.    [provider]  fenofibrate 160 MG tablet Take 160 mg by mouth daily.  11/18/12   [provider]  fluticasone (FLONASE) 50 MCG/ACT nasal spray Place 1 spray into the nose daily as needed for allergies.     [provider]  levothyroxine (SYNTHROID, LEVOTHROID) 100 MCG tablet Take 100 mcg by mouth daily before breakfast.  06/11/17   [provider]  methylPREDNISolone  (MEDROL ) 4 MG tablet Medrol  dose pack. Take as instructed 09/10/22   Vernetta Lonni GRADE, MD  Multiple  Vitamin (MULTIVITAMIN WITH MINERALS) TABS tablet Take 1 tablet by mouth daily.    [provider]  omeprazole (PRILOSEC) 40 MG capsule Take 40 mg by mouth daily.  01/07/17   [provider]  Polyethyl Glycol-Propyl Glycol (SYSTANE) 0.4-0.3 % SOLN Place 1 drop into both eyes daily.    [provider]  Turmeric 500 MG CAPS Take 500 mg by mouth daily.    [provider]    Allergies: Red yeast rice extract, Atorvastatin, Codeine, Pravastatin, and Red yeast rice [cholestin]    Review of Systems  All other systems reviewed and are negative.   Updated Vital Signs BP 130/81 (BP Location: Right Arm)   Pulse 82   Temp (!) 97.4 F (36.3 C)   Resp 18   Wt 131.5 kg   SpO2 98%   BMI 35.30 kg/m   Physical Exam Vitals and nursing note reviewed.  Constitutional:      General: He is not in acute distress.    Appearance: He is well-developed.  HENT:     Head: Normocephalic and atraumatic.     Mouth/Throat:     Comments: Tenderness left TMJ. Eyes:     Conjunctiva/sclera: Conjunctivae normal.  Cardiovascular:     Rate and Rhythm: Normal rate and regular rhythm.     Heart sounds: No murmur heard. Pulmonary:     Effort: Pulmonary effort is normal. No respiratory distress.     Breath sounds: Normal breath sounds. No wheezing, rhonchi or rales.  Abdominal:     Palpations: Abdomen is soft.     Tenderness: There is no abdominal tenderness.  Musculoskeletal:        General: No swelling.     Cervical back: Normal range of motion and neck supple. No rigidity or tenderness.     Right lower leg: No edema.     Left lower leg: No edema.     Comments: Radial pulses 2+ bilaterally.  Left distal bicep tenderness.  Skin:    General: Skin is warm and dry.     Capillary Refill: Capillary refill takes less than 2 seconds.  Neurological:     Mental Status: He is alert.     Comments: Alert and oriented to self, place, time and event.   Speech is fluent, clear without  dysarthria or dysphasia.   Strength 5/5 in upper/lower extremities   Sensation intact in upper/lower extremities   Normal gait.  Not tested CN I not tested  CN II not tested CN III, IV, VI PERRLA and EOMs intact bilaterally  CN V Intact sensation to sharp and light touch to the face  CN VII facial movements symmetric  CN VIII not tested  CN IX, X no uvula deviation, symmetric rise of soft palate  CN XI 5/5 SCM and trapezius strength bilaterally  CN XII Midline tongue protrusion, symmetric L/R movements     Psychiatric:        Mood and Affect: Mood normal.     (all labs ordered are listed, but only abnormal results are displayed) Labs Reviewed  BASIC METABOLIC PANEL WITH GFR - Abnormal; Notable for the following components:      Result Value   Glucose, Bld 129 (*)    BUN 26 (*)    Creatinine, Ser 1.29 (*)    All other components within normal limits  CBC  TSH  TROPONIN T, HIGH SENSITIVITY  TROPONIN T, HIGH SENSITIVITY    EKG: None  Radiology: CT ANGIO HEAD NECK W WO CM Result Date: 10/16/2023 CLINICAL DATA:  Headache. EXAM: CT ANGIOGRAPHY HEAD AND NECK WITH AND WITHOUT CONTRAST TECHNIQUE: Multidetector CT imaging of the head and neck was performed using the standard protocol during bolus administration of intravenous contrast. Multiplanar CT image reconstructions and MIPs were obtained to evaluate the vascular anatomy. Carotid stenosis measurements (when applicable) are obtained utilizing NASCET criteria, using the distal internal carotid diameter as the denominator. RADIATION DOSE REDUCTION: This exam was performed according to the departmental dose-optimization program which includes automated exposure control, adjustment of the mA and/or kV according to patient size and/or use of iterative reconstruction technique. CONTRAST:  75mL OMNIPAQUE  IOHEXOL  350 MG/ML SOLN COMPARISON:  Head MRI and MRA 04/04/2017 FINDINGS: CT HEAD FINDINGS Brain: There is no evidence of an acute  infarct, intracranial hemorrhage, mass, midline shift, or extra-axial fluid collection. Cerebral volume is normal. The ventricles are normal in size. Minimal cerebellar tonsillar ectopia is similar to the prior MRI. Vascular: Reported below. Skull: No fracture or suspicious lesion. Sinuses/Orbits: Minimal mucosal thickening in the maxillary sinuses. Clear mastoid air cells. Unremarkable orbits. Other: None. Review of the MIP images confirms the above findings CTA NECK FINDINGS Aortic arch: Standard branching. Widely patent brachiocephalic and subclavian arteries. Right carotid system: Patent without evidence of stenosis, dissection, or significant atherosclerosis. Left carotid system: Patent without evidence of stenosis, dissection, or significant atherosclerosis. Vertebral arteries: Patent without evidence of stenosis, dissection, or significant atherosclerosis. Slightly dominant left vertebral artery. Skeleton: Solid C5-C7 ACDF. Other neck: No evidence  of cervical lymphadenopathy or mass. Upper chest: Clear lung apices. Review of the MIP images confirms the above findings CTA HEAD FINDINGS Anterior circulation: The internal carotid arteries are widely patent from skull base to carotid termini. ACAs and MCAs are patent without evidence of a proximal branch occlusion or significant proximal stenosis. No aneurysm is identified. Posterior circulation: The intracranial vertebral arteries are widely patent to the basilar. Patent left PICA, right AICA, and bilateral SCA origins are visualized. The basilar artery is widely patent. Posterior communicating arteries are diminutive or absent. Both PCAs are patent without evidence of a significant proximal stenosis. No aneurysm is identified. Venous sinuses: Patent. Anatomic variants: None. Review of the MIP images confirms the above findings IMPRESSION: 1. No evidence of acute intracranial abnormality. 2. Negative CTA of the head and neck. Electronically Signed   By: Dasie Hamburg M.D.   On: 10/16/2023 15:03   DG Chest 2 View Result Date: 10/16/2023 EXAM: 2 VIEW(S) XRAY OF THE CHEST 10/16/2023 01:02:00 PM COMPARISON: None available. CLINICAL HISTORY: LT Jaw, LT Arm pain, abn EKG. Pt endorses bad HA and LT jaw pain, LT arm soreness upon waking. Denies CP, Endorses abnormal EKG at [facility]. FINDINGS: LUNGS AND PLEURA: No focal pulmonary opacity. No pulmonary edema. No pleural effusion. No pneumothorax. HEART AND MEDIASTINUM: No acute abnormality of the cardiac and mediastinal silhouettes. BONES AND SOFT TISSUES: No acute osseous abnormality. lower cervical spine ACDF hardware. Multilevel bridging endplate osteophytes throughout thoracic spine. IMPRESSION: 1. No acute cardiopulmonary pathology. Electronically signed by: Katheleen Faes MD 10/16/2023 01:27 PM EDT RP Workstation: [id number]     Procedures   Medications Ordered in the ED  acetaminophen  (TYLENOL ) tablet 1,000 mg (1,000 mg Oral Given 10/16/23 1427)  iohexol  (OMNIPAQUE ) 350 MG/ML injection 75 mL (75 mLs Intravenous Contrast Given 10/16/23 1432)                                    Medical Decision Making Amount and/or Complexity of Data Reviewed Labs: ordered. Radiology: ordered.  Risk OTC drugs. Prescription drug management.   This patient presents to the ED for concern of headache, generalized malaise, left arm pain this involves an extensive number of treatment options, and is a complaint that carries with it a high risk of complications and morbidity.  The differential diagnosis includes CVA, cerebral venous thrombosis, malignancy, migraine/tension/cluster headache, meningitis/encephalitis, ACS, PE, arterial dissection, other   Co morbidities that complicate the patient evaluation  See HPI   Additional history obtained:  Additional history obtained from EMR External records from outside source obtained and reviewed including hospital records   Lab Tests:  I Ordered, and personally interpreted  labs.  The pertinent results include: No leukocytosis.  No evidence of anemia.  Platelets within range.  No Electra abnormalities.  Patient baseline renal function BUN of 26, creatinine 1.29 with normal renal function.  Initial troponin of less than 15 with repeat less than 15.  TSH normal.   Imaging Studies ordered:  I ordered imaging studies including chest x-[place], CT angio head and neck I independently visualized and interpreted imaging which showed  Chest x-[place]: No acute cardiopulmonary abnormality CT angio head and neck: No acute abnormality I agree with the radiologist interpretation   Cardiac Monitoring: / EKG:  The patient was maintained on a cardiac monitor.  I personally viewed and interpreted the cardiac monitored which showed an underlying rhythm of: Normal sinus rhythm.  Left posterior fascicular block.   Consultations Obtained:  I requested consultation with attending  Problem List / ED Course / Critical interventions / Medication management  Left jaw pain I ordered medication including Tylenol    Reevaluation of the patient after these medicines showed that the patient improved I have reviewed the patients home medicines and have made adjustments as needed   Social Determinants of Health:  Denies tobacco, illicit drug use.   Test / Admission - Considered:  Left jaw pain, left arm pain Vitals signs within normal range and stable throughout visit. Laboratory/imaging studies significant for: See above 58 year old male presents emergency department with complaints of headache, abdominal pain, generalized fatigue.  States he has not been feeling well for the past week or 2.  States that he has been getting left-sided headache mainly in the crown of his head with some radiation to his left jaw daily for the past week.  States it typically does worsen when he wakes up and improves as the day goes on.  States that he woke up with some left arm pain this morning associated  with the headache prompting visit to the urgent care.  Had an EKG at that time was told to be abnormal was told to come the emergency department.  Patient denies any chest pain, shortness of breath.  Denies any fevers, chills, cough, abdominal pain, nausea, vomiting. On exam, nonfocal neurologic exam.  Patient with left distal bicep tenderness.  Workup today reassuring.  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  Patient's symptoms not necessarily worsened with exertion but noticed upon awakening. Given patient's jaw, headache which was new, CT imaging was obtained which was negative  Regarding jaw pain, seems to be isolated to left TMJ reproducible on exam.  Will trial mouthguard at home, OTC medications given his improvement with Tylenol  in the emergency department.  Regarding left upper extremity pain, no overlying skin changes concerning for secondary infectious process.  No pulse deficits suggest ischemic limb.  No reported traumatic mechanism of injury but patient does state that he was more physically active with his arms prior to going to sleep yesterday.  Seems more muscular tenderness.  Patient without chest pain rating to the back,  pulse deficits, neurodeficits; low suspicion for aortic dissection.  No real chest pain, shortness of breath, exertional worsening of symptoms, tachycardia, tachypnea, risk factors for DVT/PE; low suspicion for PE.  Will recommend close follow-up with primary care in the outpatient setting for reevaluation of symptoms.  Treatment plan discussed with patient he acknowledged understanding was agreeable.  Patient well-appearing, afebrile in no acute distress. Worrisome signs and symptoms were discussed with the patient, and the patient acknowledged understanding to return to the ED if noticed. Patient was stable upon discharge.       Final diagnoses:  None    ED Discharge Orders     None          Silver Wonda LABOR, [place] 10/16/23 1730    Lenor Hollering, MD 10/16/23 [phone]

## 2023-10-16 NOTE — ED Triage Notes (Signed)
 Pt endorses bad HA and LT jaw pain, LT arm soreness upon waking. Denies CP,  Endorses abnormal EKG at [facility]

## 2023-11-13 ENCOUNTER — Ambulatory Visit: Attending: Internal Medicine | Admitting: Internal Medicine

## 2023-11-13 ENCOUNTER — Encounter: Payer: Self-pay | Admitting: Internal Medicine

## 2023-11-13 VITALS — BP 110/82 | HR 90 | Ht 75.0 in | Wt 292.4 lb

## 2023-11-13 DIAGNOSIS — G4710 Hypersomnia, unspecified: Secondary | ICD-10-CM | POA: Diagnosis not present

## 2023-11-13 DIAGNOSIS — R9431 Abnormal electrocardiogram [ECG] [EKG]: Secondary | ICD-10-CM | POA: Diagnosis not present

## 2023-11-13 DIAGNOSIS — R072 Precordial pain: Secondary | ICD-10-CM

## 2023-11-13 DIAGNOSIS — R5382 Chronic fatigue, unspecified: Secondary | ICD-10-CM

## 2023-11-13 MED ORDER — METOPROLOL TARTRATE 100 MG PO TABS: 100.0000 mg | ORAL_TABLET | Freq: Once | ORAL | 0 refills | Status: AC

## 2023-11-13 NOTE — Progress Notes (Signed)
 Cardiology Office Note   Date:  11/13/2023  ID:  Sean Dickson, DOB 05/14/1965, MRN [id number] PCP: [facility]  [facility] Providers Cardiologist:  None     History of Present Illness Sean Dickson is a 58 y.o. male with a past medical history of GERD and Schatzki's ring, obesity, hyperlipidemia, hypothyroidism, IBS who was referred by the ED for an abnormal EKG.  Patient was in the ED on 10/16/2023 after presenting with complaints of headache, abdominal pain and generalized fatigue as well as left-sided jaw pain.  Also had associated left arm discomfort with a headache and had gone to urgent care at that time.  He had an EKG which was told to be abnormal with a left posterior fascicular block and sent to the ED.  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  Today, the patient states that he has been having exertional left-sided chest discomfort and fatigue that has been worsening over the past year.  At first he thought this was due to old age but prior to going to the ED last month he also was noting jaw discomfort and left arm discomfort.  It occurs frequently with exertion but not consistently with exertion.  He has not had any heart issues in the past.  He is a non-smoker.  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  Of note, his wife states that the patient snores frequently and does seem like he stops breathing on occasion while sleeping.  This is not every single night however.  The patient is very tired throughout the day.  Tobacco use: no  Alcohol use: no Activity level: manual labor daily  Pertinent family history: mom has afib. Grandfather MI in 50s    ROS:  Review of Systems  All other systems reviewed and are negative.   Physical Exam  Physical Exam Vitals and nursing note reviewed.  Constitutional:      Appearance: Normal appearance.  HENT:     Head: Normocephalic and atraumatic.  Eyes:     Conjunctiva/sclera:  Conjunctivae normal.  Neck:     Vascular: No carotid bruit.  Cardiovascular:     Rate and Rhythm: Normal rate and regular rhythm.  Pulmonary:     Effort: Pulmonary effort is normal.     Breath sounds: Normal breath sounds.  Musculoskeletal:        General: No swelling or tenderness.  Skin:    Coloration: Skin is not jaundiced or pale.  Neurological:     Mental Status: He is alert.     VS:  BP 110/82 (BP Location: Left Arm, Patient Position: Sitting, Cuff Size: Large)   Pulse 90   Ht 6' 3 (1.905 m)   Wt 292 lb 6.4 oz (132.6 kg)   SpO2 94%   BMI 36.55 kg/m         Wt Readings from Last 3 Encounters:  11/13/23 292 lb 6.4 oz (132.6 kg)  10/16/23 290 lb (131.5 kg)  09/01/22 280 lb (127 kg)     EKG Interpretation Date/Time:  Friday November 13 2023 08:32:09 EDT Ventricular Rate:  98 PR Interval:  194 QRS Duration:  82 QT Interval:  344 QTC Calculation: 439 R Axis:   -41  Text Interpretation: Normal sinus rhythm Left axis deviation When compared with ECG of 16-Oct-2023 12:47, Left posterior fascicular block is no longer Present Criteria for Inferior infarct are no longer Present T wave inversion no longer evident in Inferior  leads Confirmed by Kriste Hicks [phone]) on 11/13/2023 8:39:04 AM    Studies Reviewed        Risk Assessment/Calculations        STOP-Bang Score:  7        ASSESSMENT  Chest pain, possibly cardiac left-sided exertional chest discomfort with occasional radiation to the jaw and left arm that occurs but not consistent.  Also lifts heavy objects at work and could potentially be muscular.  EKG shows normal sinus rhythm, left axis deviation without pathologic ST changes Hypersomnolence STOP-BANG score: 7   Plan  Coronary CTA with beta-blocker prior Echocardiogram Sleep study  Follow up: 4 to 6 weeks          Signed, Hicks FORBES Kriste, MD

## 2023-11-13 NOTE — Patient Instructions (Signed)
 Medication Instructions:  No changes *If you need a refill on your cardiac medications before your next appointment, please call your pharmacy*  Lab Work: None ordered If you have labs (blood work) drawn today and your tests are completely normal, you will receive your results only by: MyChart Message (if you have MyChart) OR A paper copy in the mail If you have any lab test that is abnormal or we need to change your treatment, we will call you to review the results.  Testing/Procedures: Your physician has requested that you have an echocardiogram. Echocardiography is a painless test that uses sound waves to create images of your heart. It provides your doctor with information about the size and shape of your heart and how well your heart's chambers and valves are working. This procedure takes approximately one hour. There are no restrictions for this procedure. Please do NOT wear cologne, perfume, aftershave, or lotions (deodorant is allowed). Please arrive 15 minutes prior to your appointment time.  Please note: We ask at that you not bring children with you during ultrasound (echo/ vascular) testing. Due to room size and safety concerns, children are not allowed in the ultrasound rooms during exams. Our front office staff cannot provide observation of children in our lobby area while testing is being conducted. An adult accompanying a patient to their appointment will only be allowed in the ultrasound room at the discretion of the ultrasound technician under special circumstances. We apologize for any inconvenience.     Your cardiac CT will be scheduled at:   [facility] [address]  [place], [place] [address] [phone]  If scheduled at the Heart and Vascular Tower at [facility] street, please enter the parking lot using the [address] entrance and use the FREE valet service at the patient drop-off area. Enter the building and check-in with  registration on the main floor.  Please follow these instructions carefully (unless otherwise directed):  An IV will be required for this test and Nitroglycerin  will be given.  Hold all erectile dysfunction medications at least 3 days (72 hrs) prior to test. (Ie viagra, cialis, sildenafil, tadalafil, etc)   On the Night Before the Test: Be sure to Drink plenty of water. Do not consume any caffeinated/decaffeinated beverages or chocolate 12 hours prior to your test. Do not take any antihistamines 12 hours prior to your test.  On the Day of the Test: Drink plenty of water until 1 hour prior to the test. Do not eat any food 1 hour prior to test. You may take your regular medications prior to the test.  Take metoprolol (Lopressor) 100 mg two hours prior to test. If you take Furosemide/Hydrochlorothiazide/Spironolactone/Chlorthalidone, please HOLD on the morning of the test. Patients who wear a continuous glucose monitor MUST remove the device prior to scanning.  After the Test: Drink plenty of water. After receiving IV contrast, you may experience a mild flushed feeling. This is normal. On occasion, you may experience a mild rash up to 24 hours after the test. This is not dangerous. If this occurs, you can take Benadryl 25 mg, Zyrtec, Claritin, or Allegra and increase your fluid intake. (Patients taking Tikosyn should avoid Benadryl, and may take Zyrtec, Claritin, or Allegra) If you experience trouble breathing, this can be serious. If it is severe call 911 IMMEDIATELY. If it is mild, please call our office.  We will call to schedule your test 2-4 weeks out understanding that some insurance companies will need  an authorization prior to the service being performed.   For more information and frequently asked questions, please visit our website : [email or web address]  For non-scheduling related questions, please contact the cardiac imaging nurse navigator should you have any  questions/concerns: Cardiac Imaging Nurse Navigators Direct Office Dial: [phone]   For scheduling needs, including cancellations and rescheduling, please call [place], [phone].   Split night sleep study has been ordered. You will receive a call to get this scheduled. This will be performed at [facility].  [phone]  Follow-Up: At [facility], you and your health needs are our priority.  As part of our continuing mission to provide you with exceptional heart care, our providers are all part of one team.  This team includes your primary Cardiologist (physician) and Advanced Practice Providers or APPs (Physician Assistants and Nurse Practitioners) who all work together to provide you with the care you need, when you need it.  Your next appointment:    4-6 weeks   Provider:   Dr Kriste    We recommend signing up for the patient portal called MyChart.  Sign up information is provided on this After Visit Summary.  MyChart is used to connect with patients for Virtual Visits (Telemedicine).  Patients are able to view lab/test results, encounter notes, upcoming appointments, etc.  Non-urgent messages can be sent to your provider as well.   To learn more about what you can do with MyChart, go to [email or web address].

## 2023-11-24 ENCOUNTER — Telehealth: Payer: Self-pay | Admitting: Internal Medicine

## 2023-11-24 ENCOUNTER — Encounter (HOSPITAL_COMMUNITY): Payer: Self-pay

## 2023-11-24 NOTE — Telephone Encounter (Signed)
 Patient wants a call back to discuss instruction for tomorrow's CT Cardiac Morphology test and which medication he should take prior to the test.

## 2023-11-24 NOTE — Telephone Encounter (Signed)
 Patient identification verified by 2 forms.  Spoke with pt regarding his coronary CTA scheduled for tomorrow. Instructions reviewed with pt and provided via mychart. Pt repeats instructions back for clarification. Pt verbalizes understanding and has no further questions at this time.

## 2023-11-25 ENCOUNTER — Ambulatory Visit (HOSPITAL_COMMUNITY)
Admission: RE | Admit: 2023-11-25 | Discharge: 2023-11-25 | Disposition: A | Discharge status: Home / Self Care | Source: Ambulatory Visit | Attending: Internal Medicine | Admitting: Internal Medicine

## 2023-11-25 DIAGNOSIS — R072 Precordial pain: Secondary | ICD-10-CM | POA: Insufficient documentation

## 2023-11-25 MED ORDER — IOHEXOL 350 MG/ML SOLN
100.0000 mL | Freq: Once | INTRAVENOUS | Status: AC | PRN
  Administered 2023-11-25: 100 mL via INTRAVENOUS

## 2023-11-25 MED ORDER — NITROGLYCERIN 0.4 MG SL SUBL
0.8000 mg | SUBLINGUAL_TABLET | Freq: Once | SUBLINGUAL | Status: AC
  Administered 2023-11-25: 0.8 mg via SUBLINGUAL

## 2023-11-26 ENCOUNTER — Ambulatory Visit: Payer: Self-pay | Admitting: Internal Medicine

## 2023-11-30 ENCOUNTER — Encounter: Payer: Self-pay | Admitting: Radiology

## 2023-12-16 ENCOUNTER — Ambulatory Visit (HOSPITAL_COMMUNITY)
Admission: RE | Admit: 2023-12-16 | Discharge: 2023-12-16 | Disposition: A | Discharge status: Home / Self Care | Source: Ambulatory Visit | Attending: Internal Medicine | Admitting: Internal Medicine

## 2023-12-16 DIAGNOSIS — R072 Precordial pain: Secondary | ICD-10-CM | POA: Diagnosis present

## 2023-12-16 LAB — ECHOCARDIOGRAM COMPLETE
AR max vel: 2.95 cm2
AV Area VTI: 3.14 cm2
AV Area mean vel: 3.06 cm2
AV Mean grad: 4 mmHg
AV Peak grad: 7.2 mmHg
Ao pk vel: 1.34 m/s
Area-P 1/2: 3.48 cm2
MV M vel: 0.96 m/s
MV Peak grad: 3.7 mmHg
S' Lateral: 2.04 cm

## 2023-12-21 ENCOUNTER — Encounter: Payer: Self-pay | Admitting: *Deleted

## 2023-12-23 ENCOUNTER — Encounter: Payer: Self-pay | Admitting: Internal Medicine

## 2023-12-23 ENCOUNTER — Ambulatory Visit: Attending: Internal Medicine | Admitting: Internal Medicine

## 2023-12-23 ENCOUNTER — Ambulatory Visit: Admitting: Internal Medicine

## 2023-12-23 VITALS — BP 124/80 | HR 85 | Ht 75.0 in | Wt 301.0 lb

## 2023-12-23 DIAGNOSIS — R079 Chest pain, unspecified: Secondary | ICD-10-CM | POA: Diagnosis not present

## 2023-12-23 DIAGNOSIS — E782 Mixed hyperlipidemia: Secondary | ICD-10-CM

## 2023-12-23 DIAGNOSIS — G4710 Hypersomnia, unspecified: Secondary | ICD-10-CM

## 2023-12-23 NOTE — Patient Instructions (Signed)
 Medication Instructions:  No medication changes were made at this visit. Continue current regimen.   Follow-Up: At [facility], you and your health needs are our priority.  As part of our continuing mission to provide you with exceptional heart care, our providers are all part of one team.  This team includes your primary Cardiologist (physician) and Advanced Practice Providers or APPs (Physician Assistants and Nurse Practitioners) who all work together to provide you with the care you need, when you need it.  Your next appointment:   As needed  Provider:   Emeline FORBES Calender, DO    We recommend signing up for the patient portal called MyChart.  Sign up information is provided on this After Visit Summary.  MyChart is used to connect with patients for Virtual Visits (Telemedicine).  Patients are able to view lab/test results, encounter notes, upcoming appointments, etc.  Non-urgent messages can be sent to your provider as well.   To learn more about what you can do with MyChart, go to [email or web address].

## 2023-12-23 NOTE — Progress Notes (Signed)
 Cardiology Office Note   Date:  12/23/2023  ID:  Sean Dickson, DOB [birth year], MRN [id number] PCP: [facility]  [facility] Providers Cardiologist:  Emeline FORBES Calender, DO     History of Present Illness Sean Dickson is a 58 y.o. male with a past medical history of GERD and Schatzki's ring, obesity, hyperlipidemia, hypothyroidism, IBS who was referred by the ED for an abnormal EKG.  Patient was in the ED on 10/16/2023 after presenting with complaints of headache, abdominal pain and generalized fatigue as well as left-sided jaw pain.  Also had associated left arm discomfort with a headache and had gone to urgent care at that time.  He had an EKG which was told to be abnormal with a left posterior fascicular block and sent to the ED.  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  At his last visit on 10/17, the patient stated that he was having exertional left-sided chest discomfort and fatigue that has been worsening over the past year.  At first he thought this was due to old age but prior to going to the ED last month he also was noting jaw discomfort and left arm discomfort.    Since that time he has undergone an echocardiogram and CCTA, both of which came back normal with a calcium score of 0. There was small hiatal hernia and punctate lung nodule. He has not had any recurrent chest pain. He was found to have a sinus infection that was causing his jaw pain and it resolved after antibiotics.   Tobacco use: no  Alcohol use: no Activity level: manual labor daily  Pertinent family history: mom has afib. Grandfather MI in 36s    ROS:  Review of Systems  All other systems reviewed and are negative.   Physical Exam  Physical Exam Vitals and nursing note reviewed.  Constitutional:      Appearance: Normal appearance.  HENT:     Head: Normocephalic and atraumatic.  Eyes:     Conjunctiva/sclera: Conjunctivae normal.  Cardiovascular:     Pulses: Normal pulses.   Pulmonary:     Effort: Pulmonary effort is normal.  Musculoskeletal:        General: No swelling or tenderness.  Skin:    Coloration: Skin is not jaundiced or pale.  Neurological:     Mental Status: He is alert.     VS:  BP 124/80 (BP Location: Left Arm, Patient Position: Sitting, Cuff Size: Large)   Pulse 85   Ht 6' 3 (1.905 m)   Wt (!) 301 lb (136.5 kg)   SpO2 98%   BMI 37.62 kg/m         Wt Readings from Last 3 Encounters:  12/23/23 (!) 301 lb (136.5 kg)  11/13/23 292 lb 6.4 oz (132.6 kg)  10/16/23 290 lb (131.5 kg)     EKG Interpretation Date/Time:    Ventricular Rate:    PR Interval:    QRS Duration:    QT Interval:    QTC Calculation:   R Axis:      Text Interpretation:      Studies Reviewed   Echocardiogram 12/16/2023:  1. Left ventricular ejection fraction, by estimation, is 55 to 60%. The  left ventricle has normal function. The left ventricle has no regional  wall motion abnormalities. There is severe left ventricular hypertrophy.  Left ventricular diastolic parameters   were normal.   2. Right ventricular systolic function is normal. The right ventricular  size is normal.   3. The mitral valve is normal in structure. No evidence of mitral valve  regurgitation. No evidence of mitral stenosis.   4. The aortic valve is tricuspid. Aortic valve regurgitation is not  visualized. No aortic stenosis is present.   5. The inferior vena cava is normal in size with greater than 50%  respiratory variability, suggesting right atrial pressure of 3 mmHg.    CCTA 12/03/23 IMPRESSION: 1. No evidence of CAD, CADRADS = 0. 2. Normal coronary origin with right dominance. 3. Coronary artery calcium score is 0. 4. Aorta: Borderline dilated at 38 mm at the mid ascending aorta (level of the PA bifurcation) measured double oblique. Dilated coronary sinus to 41 mm. No calcifications. No dissection. 5. Consider non-coronary causes of chest pain.   Risk  Assessment/Calculations        STOP-Bang Score:  7        ASSESSMENT  Chest pain, noncardiac, possibly related to hiatal hernia echo and CCTA were both normal with calcium score of 0.  Jaw pain secondary to sinus infection and not cardiac related Dyslipidemia LDL minimally elevated at 884 on his records from outside facility Hypersomnolence Sleep study pending Punctate pulmonary nodule   Plan  Patient can discontinue aspirin  and fenofibrate from a cardiac perspective given his calcium score of 0 and low risk factors. Will defer to PCP and/or neurology  Follow up with PCP for pulmonary nodule but likely doesn't need further evaluation  Follow up: as needed          Signed, Emeline FORBES Calender, DO

## 2024-01-11 ENCOUNTER — Telehealth: Payer: Self-pay | Admitting: Orthopaedic Surgery

## 2024-01-11 NOTE — Telephone Encounter (Signed)
 Pt called wanting to make an apt for a gel injection and wants to get approval from his insrance company and get this before the end of the year. Call back number is [phone]

## 2024-01-12 ENCOUNTER — Other Ambulatory Visit: Payer: Self-pay

## 2024-01-12 DIAGNOSIS — M1711 Unilateral primary osteoarthritis, right knee: Secondary | ICD-10-CM

## 2024-01-12 NOTE — Telephone Encounter (Signed)
 Talked with patient and appt. Has been scheduled.

## 2024-01-25 ENCOUNTER — Ambulatory Visit: Admitting: Physician Assistant

## 2024-01-25 ENCOUNTER — Encounter: Payer: Self-pay | Admitting: Physician Assistant

## 2024-01-25 ENCOUNTER — Telehealth: Payer: Self-pay | Admitting: *Deleted

## 2024-01-25 DIAGNOSIS — M1711 Unilateral primary osteoarthritis, right knee: Secondary | ICD-10-CM | POA: Diagnosis not present

## 2024-01-25 MED ORDER — SODIUM HYALURONATE 60 MG/3ML IX PRSY
60.0000 mg | PREFILLED_SYRINGE | INTRA_ARTICULAR | Status: AC | PRN
  Administered 2024-01-25: 60 mg via INTRA_ARTICULAR

## 2024-01-25 NOTE — Telephone Encounter (Addendum)
 Fax came for this patient on onbase on Friday 01/22/24. I was on [place]. It ask for additional information to be submitted (relevant physician office notes) by 01/22/24 12:00 pm to avoid delay or denial of services for missing clinical information. Prior shara was started by Dena Hesselbach. [id number] Ref# [id number].Service start dates are 03-07-24  to  End date 06/05/2024. I have submitted the clinical notes today.

## 2024-01-25 NOTE — Progress Notes (Signed)
" ° °  Procedure Note  Patient: Sean Dickson             Date of Birth: [birth year]           MRN: [id number]             Visit Date: 01/25/2024  HPI: Sean Dickson returns today for Durolane injection right knee.  He states the last injection gave him good results until the last month or so.  He states now he is having some good and bad days but still more good days and bad days regards to his right knee pain.  He has known osteoarthritis of the right knee.  He has no scheduled surgery on the right knee in the next 6 months.  He is working with systems analyst for overall fitness focusing on quad strengthening.  Denies any injury to either knee.  Review of systems: See HPI otherwise negative  Physical exam: General Well-developed well-nourished male no acute distress mood and affect appropriate.  Ambulates without any assistive device. Bilateral knees: Good range of motion both knees no abnormal warmth erythema or effusion.  Right tennis with passive range of motion.  Procedures: Visit Diagnoses:  1. Primary osteoarthritis of right knee     Large Joint Inj: R knee on 01/25/2024 8:34 AM Indications: pain Details: 22 G 1.5 in needle, anterolateral approach  Arthrogram: No  Medications: 60 mg Sodium Hyaluronate 60 MG/3ML Outcome: tolerated well, no immediate complications Procedure, treatment alternatives, risks and benefits explained, specific risks discussed. Consent was given by the patient. Immediately prior to procedure a time out was called to verify the correct patient, procedure, equipment, support staff and site/side marked as required. Patient was prepped and draped in the usual sterile fashion.     Plan: He will follow-up with us  as needed Sean Dickson 6 months between viscosupplementation injections.  Questions were encouraged and answered.   "
